# Patient Record
Sex: Male | Born: 1995 | Race: Black or African American | Hispanic: No | Marital: Single | Smoking: Former smoker
Health system: Southern US, Community
[De-identification: ages and names within clinical notes are randomized; demographics above are authoritative.]

---
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
## 2013-01-11 ENCOUNTER — Encounter (HOSPITAL_COMMUNITY): Payer: Self-pay | Admitting: Emergency Medicine

## 2013-01-11 ENCOUNTER — Emergency Department (HOSPITAL_COMMUNITY)
Admission: EM | Admit: 2013-01-11 | Discharge: 2013-01-11 | Disposition: A | Discharge status: Home / Self Care | Payer: Medicaid Other | Attending: Emergency Medicine | Admitting: Emergency Medicine

## 2013-01-11 ENCOUNTER — Emergency Department (HOSPITAL_COMMUNITY): Payer: Medicaid Other

## 2013-01-11 DIAGNOSIS — Y929 Unspecified place or not applicable: Secondary | ICD-10-CM | POA: Insufficient documentation

## 2013-01-11 DIAGNOSIS — S91331A Puncture wound without foreign body, right foot, initial encounter: Secondary | ICD-10-CM

## 2013-01-11 DIAGNOSIS — S91309A Unspecified open wound, unspecified foot, initial encounter: Secondary | ICD-10-CM | POA: Insufficient documentation

## 2013-01-11 DIAGNOSIS — W268XXA Contact with other sharp object(s), not elsewhere classified, initial encounter: Secondary | ICD-10-CM | POA: Insufficient documentation

## 2013-01-11 DIAGNOSIS — Z23 Encounter for immunization: Secondary | ICD-10-CM | POA: Insufficient documentation

## 2013-01-11 DIAGNOSIS — F17200 Nicotine dependence, unspecified, uncomplicated: Secondary | ICD-10-CM | POA: Insufficient documentation

## 2013-01-11 DIAGNOSIS — Y939 Activity, unspecified: Secondary | ICD-10-CM | POA: Insufficient documentation

## 2013-01-11 MED ORDER — AMOXICILLIN-POT CLAVULANATE 875-125 MG PO TABS: 1.0000 | ORAL_TABLET | Freq: Two times a day (BID) | ORAL | Status: DC

## 2013-01-11 MED ORDER — IBUPROFEN 800 MG PO TABS: 800.0000 mg | ORAL_TABLET | Freq: Three times a day (TID) | ORAL | Status: DC

## 2013-01-11 MED ORDER — AMOXICILLIN-POT CLAVULANATE 875-125 MG PO TABS
1.0000 | ORAL_TABLET | Freq: Once | ORAL | Status: AC
  Administered 2013-01-11: 1 via ORAL
  Filled 2013-01-11: qty 1

## 2013-01-11 MED ORDER — TETANUS-DIPHTH-ACELL PERTUSSIS 5-2.5-18.5 LF-MCG/0.5 IM SUSP
0.5000 mL | Freq: Once | INTRAMUSCULAR | Status: AC
  Administered 2013-01-11: 0.5 mL via INTRAMUSCULAR
  Filled 2013-01-11: qty 0.5

## 2013-01-11 NOTE — ED Notes (Signed)
Stepped on a nail to plantar surface of rt foot 2 days ago
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
## 2013-01-13 NOTE — ED Provider Notes (Signed)
CSN: [id number]     Arrival date & time 01/11/13  1111 History   First MD Initiated Contact with Patient 01/11/13 1208     Chief Complaint  Patient presents with  . Foot Pain   (Consider location/radiation/quality/duration/timing/severity/associated sxs/prior Treatment) Patient is a 17 y.o. male presenting with lower extremity pain. The history is provided by the patient.  Foot Pain This is a new problem. Episode onset: 2 days ago. The problem occurs constantly. The problem has been unchanged. Associated symptoms include arthralgias. Pertinent negatives include no chills, fever, headaches, joint swelling, nausea, neck pain, numbness, rash, vomiting or weakness. The symptoms are aggravated by walking and standing. He has tried nothing for the symptoms. The treatment provided no relief.   patient reports pain to plantar surface of the right foot after stepping on a nail 2 days ago.  Also c/o redness to the area and states he does not remember his last tetanus injection.  He denies fever, chills, swelling or red streaks from the wound History reviewed. No pertinent past medical history. History reviewed. No pertinent past surgical history. History reviewed. No pertinent family history. History  Substance Use Topics  . Smoking status: Current Every Day Smoker  . Smokeless tobacco: Not on file  . Alcohol Use: Yes    Review of Systems  Constitutional: Negative for fever and chills.  Gastrointestinal: Negative for nausea and vomiting.  Genitourinary: Negative for dysuria and difficulty urinating.  Musculoskeletal: Positive for arthralgias. Negative for joint swelling and neck pain.       Puncture wound to right foot  Skin: Negative for color change, rash and wound.  Neurological: Negative for weakness, numbness and headaches.  All other systems reviewed and are negative.    Allergies  Review of patient's allergies indicates no known allergies.  Home Medications   Current Outpatient  Rx  Name  Route  Sig  Dispense  Refill  . amoxicillin-clavulanate (AUGMENTIN) 875-125 MG per tablet   Oral   Take 1 tablet by mouth 2 (two) times daily. For 10 days   20 tablet   0   . ibuprofen (ADVIL,MOTRIN) 800 MG tablet   Oral   Take 1 tablet (800 mg total) by mouth 3 (three) times daily.   21 tablet   0    BP 129/70  Pulse 85  Temp(Src) 97.5 F (36.4 C) (Oral)  Ht 6' (1.829 m)  Wt 230 lb (104.327 kg)  BMI 31.19 kg/m2  SpO2 100% Physical Exam  Nursing note and vitals reviewed. Constitutional: He is oriented to person, place, and time. He appears well-developed and well-nourished. No distress.  HENT:  Head: Normocephalic and atraumatic.  Cardiovascular: Normal rate, regular rhythm, normal heart sounds and intact distal pulses.   Pulmonary/Chest: Effort normal and breath sounds normal. No respiratory distress.  Musculoskeletal: He exhibits tenderness. He exhibits no edema.  Pin point wound to plantar surface of right mid foot. No lymphangitis  ROM is preserved.  DP pulse is brisk,distal sensation intact.  No erythema, abrasion, bruising or bony deformity.  No proximal tenderness.  Neurological: He is alert and oriented to person, place, and time. He exhibits normal muscle tone. Coordination normal.  Skin: Skin is warm and dry. No rash noted. No erythema.    ED Course  Procedures (including critical care time) Labs Review Labs Reviewed - No data to display  Imaging Review Dg Foot Complete Right  01/11/2013   CLINICAL DATA:  Right foot pain, recent penetrating injury  EXAM: RIGHT FOOT  COMPLETE - 3+ VIEW  COMPARISON:  None.  FINDINGS: No acute fracture or dislocation is noted. No gross soft tissue abnormality is seen. No radiopaque foreign body is noted.  IMPRESSION: No acute abnormality seen.   Electronically Signed   By: Alcide Clever M.D.   On: 01/11/2013 12:34    EKG Interpretation   None       MDM   1. Puncture wound of foot, right, initial encounter     Puncture wound to right foot.  No concerning sx's for cellulitis at this time, NV intact.  Will update Td and rx augmentin.  Pt agrees to return here if any worsening sx's.  Ambulates w/o difficulty    Felicita Nuncio L. Natori Gudino, PA-C 01/13/13 1326

## 2013-01-14 NOTE — ED Provider Notes (Signed)
Medical screening examination/treatment/procedure(s) were performed by non-physician practitioner and as supervising physician I was immediately available for consultation/collaboration.  EKG Interpretation   None         Kateland Leisinger, MD 01/14/13 0942 

## 2013-02-25 ENCOUNTER — Ambulatory Visit: Payer: Self-pay | Admitting: Nurse Practitioner

## 2013-03-14 DIAGNOSIS — Z0289 Encounter for other administrative examinations: Secondary | ICD-10-CM

## 2013-12-26 ENCOUNTER — Ambulatory Visit (INDEPENDENT_AMBULATORY_CARE_PROVIDER_SITE_OTHER): Payer: Medicaid Other | Admitting: Family Medicine

## 2013-12-26 ENCOUNTER — Ambulatory Visit (HOSPITAL_COMMUNITY)
Admission: RE | Admit: 2013-12-26 | Discharge: 2013-12-26 | Disposition: A | Discharge status: Home / Self Care | Payer: Medicaid Other | Source: Ambulatory Visit | Attending: Family Medicine | Admitting: Family Medicine

## 2013-12-26 ENCOUNTER — Encounter: Payer: Self-pay | Admitting: Family Medicine

## 2013-12-26 VITALS — BP 118/76 | Ht 74.0 in | Wt 264.0 lb

## 2013-12-26 DIAGNOSIS — M25511 Pain in right shoulder: Secondary | ICD-10-CM

## 2013-12-26 MED ORDER — NAPROXEN 500 MG PO TABS: 500.0000 mg | ORAL_TABLET | Freq: Two times a day (BID) | ORAL | Status: DC | PRN
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
## 2013-12-27 ENCOUNTER — Ambulatory Visit (HOSPITAL_COMMUNITY)
Admission: RE | Admit: 2013-12-27 | Discharge: 2013-12-27 | Disposition: A | Discharge status: Home / Self Care | Payer: Medicaid Other | Source: Ambulatory Visit | Attending: Family Medicine | Admitting: Family Medicine

## 2013-12-27 DIAGNOSIS — M25511 Pain in right shoulder: Secondary | ICD-10-CM | POA: Diagnosis not present

## 2013-12-30 NOTE — Progress Notes (Signed)
Patient notified and verbalized understanding. 

## 2014-01-12 ENCOUNTER — Encounter: Payer: Self-pay | Admitting: Orthopedic Surgery

## 2014-01-12 ENCOUNTER — Ambulatory Visit (INDEPENDENT_AMBULATORY_CARE_PROVIDER_SITE_OTHER): Payer: Medicaid Other | Admitting: Orthopedic Surgery

## 2014-01-12 VITALS — BP 103/67 | Ht 74.0 in | Wt 264.0 lb

## 2014-01-12 DIAGNOSIS — S43004A Unspecified dislocation of right shoulder joint, initial encounter: Secondary | ICD-10-CM

## 2014-01-12 NOTE — Progress Notes (Signed)
Patient ID: Tanner Rhodes, male   DOB: [birth year], 18 y.o.   MRN: [id number] Subjective:     Tanner Rhodes is a 18 y.o. male who injured his shoulder playing football about 2 years ago. Since that time he has a painful clicking sensation over the right shoulder which is reproducible by manipulating the shoulder. He has significant pain over his before meals joint and pain with forward elevation with radiates into his neck and is associated with some numbness in his right upper extremity. The pain is become constant and is severe enough to be 8 out of 10. He had a negative x-ray. His pain is worse with throwing, lifting. He says that he feels a grinding sensation in the shoulder as well. Previous treatment none  Outside reports reviewed: office notes.  The following portions of the patient's history were reviewed and updated as appropriate: allergies, current medications, past family history, past medical history, past social history, past surgical history and problem list.   the patient reports his review of systems as vision problems numbness tingling weakness joint pain muscle weakness stiff joints and back pain  BP 103/67 mmHg  Ht 6\' 2"  (1.88 m)  Wt 264 lb (119.75 kg)  BMI 33.88 kg/m2  Objective:    General:  alert, cooperative, appears stated age, mildly obese and normal hygiene normal grooming, mood and affect normal. He is oriented 3.  Gait:  Normal.    Right Shoulder Bruising:   absent  Crepitus:  AC joint, glenohumeral joint, scapulo-thoracic  Joint Tenderness:  AC joint, posterior acromial, trapezius muscle  Effusion:   none present           Active ROM:   he has painful for elevation between the ranges of motion of 100 and 150. He has normal external rotation normal abduction at 90 with external rotation normal.  Neer:   positive  Hawkins  positive  Stability:   apprehension sign anterior  Apprehension Sign:   neg/pos positive/positive  Atrophy:   none noted   Strength:  biceps 5/5, triceps 5/5, abduction 5/5, adduction 5/5, external rotation 5/5 with shoulder at side, flexion 5/5, and extension 5/5  Left shoulder Normal range of motion, stability and strength. No tenderness. SKIN normal both shoulders CV normal radial pulses bilaterally LYMPH negative palpable lymph nodes bilaterally axilla and supraclavicular SENSATION normal palpable soft touch proprioceptive sensation in each upper extremity DTR 2+ at the elbow and forearm bilaterally equal COORDINATION normal in both upper extremities with fine motor tasks  CSPINE EVAL: no tenderness or loss of motion Imaging X-Ray: the x-ray taken at the hospital does not show any abnormality and is has been independently reviewed  Assessment:    right shoulder abnormality I cannot tell if he has dislocation of the glenohumeral joint or an old before meals joint injury     Plan:  Recommend MRI of the right shoulder to evaluate the glenohumeral ligaments and the before meals ligaments  Recommend Naprosyn 500 mg twice a day #60

## 2014-01-12 NOTE — Patient Instructions (Signed)
MRI and [facility] follow up.

## 2014-01-25 ENCOUNTER — Telehealth: Payer: Self-pay | Admitting: Orthopedic Surgery

## 2014-01-25 ENCOUNTER — Telehealth: Payer: Self-pay | Admitting: *Deleted

## 2014-01-25 NOTE — Telephone Encounter (Addendum)
Regarding MRI ordered for right shoulder,[id number] -- received denial, 01/24/14, per [employer], insurer Medicaid's third party contact (Ph [phone]).  I called patient to notify; left message at home# [phone] to return call.  Patient will also receive a letter from insurer with this information and with detailed information as to options. * *Patient returned call same day, 01/24/14, at approximately 4:05p.m; voiced understanding of above information.

## 2014-01-25 NOTE — Telephone Encounter (Signed)
NOTE  SENT TO PATIENTS PCP ADVISING OF DR HARRISON'S RECOMMENDATION FOR NEUROLOGY REFERRAL FOR [facility] ADVISED PATIENTS INSURANCE REQUIRES REFERRAL FROM PCP OUR OFFICE NOTE WAS ATTACHED

## 2014-01-29 ENCOUNTER — Other Ambulatory Visit: Payer: Self-pay | Admitting: Family Medicine

## 2014-01-29 DIAGNOSIS — R202 Paresthesia of skin: Principal | ICD-10-CM

## 2014-01-29 DIAGNOSIS — R200 Anesthesia of skin: Secondary | ICD-10-CM

## 2014-01-29 NOTE — Progress Notes (Signed)
The patient having some tingling in the right upper extremity. Dr. [facility] would like nerve conduction study via neurology. Consult was put in.

## 2014-01-31 ENCOUNTER — Encounter: Payer: Self-pay | Admitting: Family Medicine

## 2014-11-28 ENCOUNTER — Encounter: Payer: Self-pay | Admitting: Family Medicine

## 2014-11-28 ENCOUNTER — Ambulatory Visit (INDEPENDENT_AMBULATORY_CARE_PROVIDER_SITE_OTHER): Payer: Medicaid Other | Admitting: Family Medicine

## 2014-11-28 VITALS — BP 124/82 | Temp 97.9°F | Ht 74.0 in | Wt 276.2 lb

## 2014-11-28 DIAGNOSIS — M2440 Recurrent dislocation, unspecified joint: Secondary | ICD-10-CM | POA: Diagnosis not present

## 2014-11-28 DIAGNOSIS — M7662 Achilles tendinitis, left leg: Secondary | ICD-10-CM

## 2014-11-28 DIAGNOSIS — M7581 Other shoulder lesions, right shoulder: Secondary | ICD-10-CM

## 2014-11-28 DIAGNOSIS — M25511 Pain in right shoulder: Secondary | ICD-10-CM | POA: Diagnosis not present

## 2014-11-28 MED ORDER — DICLOFENAC SODIUM 75 MG PO TBEC: 75.0000 mg | DELAYED_RELEASE_TABLET | Freq: Two times a day (BID) | ORAL | Status: DC

## 2014-11-28 NOTE — Progress Notes (Signed)
   Subjective:    Patient ID: Tanner Rhodes, male    DOB: 1996-03-01, 19 y.o.   MRN: [id number]  Foot Injury  The incident occurred more than 1 week ago. The incident occurred at home. There was no injury mechanism. The pain is present in the left heel. The quality of the pain is described as aching. The pain is moderate. The pain has been intermittent since onset. He reports no foreign bodies present. The symptoms are aggravated by weight bearing. He has tried NSAIDs for the symptoms. The treatment provided no relief.  Shoulder Pain  The pain is present in the right shoulder. This is a chronic problem. The current episode started more than 1 year ago. The problem occurs intermittently. The problem has been unchanged. The quality of the pain is described as aching. The pain is moderate. The symptoms are aggravated by activity. He has tried NSAIDS for the symptoms. The treatment provided no relief.   He states he did see orthopedics at MRI scheduled and he believes that he missed the appointment was canceled he has not been able to go back to get it rescheduled. He has seen orthopedics he has tried anti-inflammatory is try to exercise without success    Review of Systems He relates shoulder pain discomfort hurts with movement related to intermittently dislocates patient also relates intermittent left heel pain and discomfort when he walks    Objective:   Physical Exam On physical exam neck no tenderness no masses. She does have some moderate trapezius tenderness. He has limited range of motion of the right shoulder. Positive apprehension test. Also patient has difficult time putting on behind the right back region and has limited strength in raising the arm upward. Significant pain and tenderness in the joint line. Patient is also able to intermittently dislocate his shoulder partial. Lungs are clear hearts regular left foot patient has significant acute tendinitis but no sign of any type of  fracture no sign of plantar fasciitis       Assessment & Plan:  Right shoulder pain chronic with intermittent dislocation patient with probable rotator cuff tear versus tendinitis has Arty tried conservative measures including a few months of anti-inflammatory gentle range of motion exercises without success. I recommend MRI. May well need referral back to Dr. Romeo Apple.  Left heel East a anti-inflammatory cold compresses if ongoing trouble may need injections

## 2014-11-30 ENCOUNTER — Other Ambulatory Visit: Payer: Self-pay | Admitting: *Deleted

## 2014-11-30 MED ORDER — MELOXICAM 15 MG PO TABS: 15.0000 mg | ORAL_TABLET | Freq: Every day | ORAL | Status: DC

## 2014-12-12 ENCOUNTER — Ambulatory Visit (HOSPITAL_COMMUNITY)
Admission: RE | Admit: 2014-12-12 | Discharge: 2014-12-12 | Disposition: A | Discharge status: Home / Self Care | Payer: Medicaid Other | Source: Ambulatory Visit | Attending: Family Medicine | Admitting: Family Medicine

## 2014-12-12 DIAGNOSIS — M25411 Effusion, right shoulder: Secondary | ICD-10-CM | POA: Insufficient documentation

## 2014-12-12 DIAGNOSIS — M25511 Pain in right shoulder: Secondary | ICD-10-CM | POA: Diagnosis present

## 2014-12-12 DIAGNOSIS — R937 Abnormal findings on diagnostic imaging of other parts of musculoskeletal system: Secondary | ICD-10-CM | POA: Diagnosis not present

## 2014-12-18 ENCOUNTER — Other Ambulatory Visit: Payer: Self-pay

## 2014-12-18 DIAGNOSIS — M778 Other enthesopathies, not elsewhere classified: Secondary | ICD-10-CM

## 2014-12-18 DIAGNOSIS — M7581 Other shoulder lesions, right shoulder: Principal | ICD-10-CM

## 2015-01-01 ENCOUNTER — Ambulatory Visit: Payer: Medicaid Other | Admitting: Orthopedic Surgery

## 2015-01-01 ENCOUNTER — Encounter: Payer: Self-pay | Admitting: Orthopedic Surgery

## 2015-01-11 ENCOUNTER — Encounter: Payer: Self-pay | Admitting: Orthopedic Surgery

## 2015-01-11 ENCOUNTER — Ambulatory Visit (INDEPENDENT_AMBULATORY_CARE_PROVIDER_SITE_OTHER): Payer: Medicaid Other | Admitting: Orthopedic Surgery

## 2015-01-11 VITALS — BP 122/74 | Ht 74.0 in | Wt 276.0 lb

## 2015-01-11 DIAGNOSIS — S43004S Unspecified dislocation of right shoulder joint, sequela: Secondary | ICD-10-CM | POA: Diagnosis not present

## 2015-01-11 MED ORDER — NAPROXEN 500 MG PO TABS: 500.0000 mg | ORAL_TABLET | Freq: Two times a day (BID) | ORAL | Status: DC

## 2015-01-11 NOTE — Patient Instructions (Addendum)
We will let your primary care doctor of recommendation to refer to shoulder specialist Dr Dion SaucierLandau  Medicine sent to your pharmacy

## 2015-01-11 NOTE — Progress Notes (Signed)
Chief complaint persistent clicking popping right shoulder  19 year old male injured his shoulder playing football 2 and 3:30 years ago. Since that time he has had a painful clicking sensation of the right shoulder which is reproducible with manipulation of the shoulder. He has significant pain over the acromial clavicular joint and pain with abduction external rotation and elevation of the shoulder. He was treated with ibuprofen and did not improve he persists of complaints of a grinding sensation and catching sensation in the shoulder.  Review of systems various vision disturbance, mild numbness and tingling in the right arm on occasion weakness of the shoulder joint and stiffness. Also complains of back pain.  BP 122/74 mmHg  Ht 6\' 2"  (1.88 m)  Wt 276 lb (125.193 kg)  BMI 35.42 kg/m2 Again the shoulder clicked and popped had a painful arc of motion between 100 and 150 his external rotation was normal in abduction at 90 with external rotation normal. Neer sign positive Hawkins sign positive apprehension with abduction external rotation no atrophy scapular motion appeared to be relatively cohesive with the shoulder joint.  No neurovascular deficits detected  Axillary lymph nodes normal  I suspect he has micro-instability because he doesn't have rotator cuff tear MRI prove that  Recommend second opinion with Dr. Dion SaucierLandau for micro-instability. Wonder if he may need exam under anesthesia and then arthroscopic capsulorrhaphy.

## 2015-07-28 ENCOUNTER — Emergency Department (HOSPITAL_COMMUNITY): Payer: Medicaid Other

## 2015-07-28 ENCOUNTER — Emergency Department (HOSPITAL_COMMUNITY)
Admission: EM | Admit: 2015-07-28 | Discharge: 2015-07-28 | Disposition: A | Discharge status: Home / Self Care | Payer: Medicaid Other | Attending: Emergency Medicine | Admitting: Emergency Medicine

## 2015-07-28 ENCOUNTER — Encounter (HOSPITAL_COMMUNITY): Payer: Self-pay

## 2015-07-28 DIAGNOSIS — S61309A Unspecified open wound of unspecified finger with damage to nail, initial encounter: Secondary | ICD-10-CM

## 2015-07-28 DIAGNOSIS — Y929 Unspecified place or not applicable: Secondary | ICD-10-CM | POA: Insufficient documentation

## 2015-07-28 DIAGNOSIS — Y939 Activity, unspecified: Secondary | ICD-10-CM | POA: Diagnosis not present

## 2015-07-28 DIAGNOSIS — Y999 Unspecified external cause status: Secondary | ICD-10-CM | POA: Insufficient documentation

## 2015-07-28 DIAGNOSIS — S61306A Unspecified open wound of right little finger with damage to nail, initial encounter: Secondary | ICD-10-CM | POA: Diagnosis not present

## 2015-07-28 DIAGNOSIS — S6721XA Crushing injury of right hand, initial encounter: Secondary | ICD-10-CM

## 2015-07-28 DIAGNOSIS — W230XXA Caught, crushed, jammed, or pinched between moving objects, initial encounter: Secondary | ICD-10-CM | POA: Insufficient documentation

## 2015-07-28 DIAGNOSIS — S67196A Crushing injury of right little finger, initial encounter: Secondary | ICD-10-CM | POA: Diagnosis present

## 2015-07-28 DIAGNOSIS — Z87891 Personal history of nicotine dependence: Secondary | ICD-10-CM | POA: Diagnosis not present

## 2015-07-28 MED ORDER — BUPIVACAINE HCL (PF) 0.5 % IJ SOLN
10.0000 mL | Freq: Once | INTRAMUSCULAR | Status: AC
  Administered 2015-07-28: 10 mL
  Filled 2015-07-28: qty 30

## 2015-07-28 MED ORDER — TRAMADOL HCL 50 MG PO TABS: 50.0000 mg | ORAL_TABLET | Freq: Four times a day (QID) | ORAL | Status: DC | PRN

## 2015-07-28 MED ORDER — NAPROXEN 500 MG PO TABS: 500.0000 mg | ORAL_TABLET | Freq: Two times a day (BID) | ORAL | Status: DC

## 2015-07-28 NOTE — ED Notes (Signed)
Was closing the trunk and got my little finger cut per pt.

## 2015-07-28 NOTE — Discharge Instructions (Signed)
Fingernail or Toenail Removal, Care After Refer to this sheet in the next few weeks. These instructions provide you with information about caring for yourself after your procedure. Your health care provider may also give you more specific instructions. Your treatment has been planned according to current medical practices, but problems sometimes occur. Call your health care provider if you have any problems or questions after your procedure. WHAT TO EXPECT AFTER THE PROCEDURE After your procedure, it is common to have:  Redness.  Swelling. HOME CARE INSTRUCTIONS  If you have a splint on your finger:  Wear it as directed by your health care provider. Remove it only as directed by your health care provider.  Loosen the splint if your fingers become numb and tingle, or if they turn cold and blue.  If you were given a surgical shoe, wear it as directed by your health care provider.  Take medicines only as directed by your health care provider.  Elevate your hand or foot as much of the time as possible. This helps with pain and swelling.  If you are recovering from fingernail removal, keep your hand raised above the level of your heart.  If you are recovering from toenail removal, lie on a bed or a couch with your leg propped up on pillows, or sit in a reclining chair with the footrest up.  Follow instructions from your health care provider about bandage (dressing) changes and removal:  Change your dressing 24 hours after your procedure or as directed by your health care provider.  Soak your hand or foot in warm, soapy water for 10 minutes 2 times per day. This reduces pain and swelling.  After you soak your hand or foot, apply a clean, dry dressing.  Keep your dressing clean and dry. Change your dressing whenever it gets wet or dirty.  Keep all follow-up visits as directed by your health care provider. This is important. SEEK MEDICAL CARE IF:  You have increased redness or pain at  your nail area.  You have increased fluid, blood, or pus coming from your nail area.  There is a bad smell coming from the dressing.  You have a fever.  Your swelling gets worse, or you have swelling that spreads from your finger to your hand or from your toe to your foot.  You have worsening redness that spreads from your finger to your hand or from your toe up to your foot.  Your finger or toe looks blue or black.   This information is not intended to replace advice given to you by your health care provider. Make sure you discuss any questions you have with your health care provider.   Document Released: 03/17/2014 Document Reviewed: 03/17/2014 Elsevier Interactive Patient Education [employer]2016 [employer]

## 2015-07-28 NOTE — ED Notes (Signed)
Patient verbalizes understanding of discharge instructions, prescriptions, home care and follow up care. Patient out of department at this time. 

## 2015-07-28 NOTE — ED Provider Notes (Signed)
CSN: [id number]     Arrival date & time 07/28/15  1932 History   First MD Initiated Contact with Patient 07/28/15 1951     Chief Complaint  Patient presents with  . Finger Injury     (Consider location/radiation/quality/duration/timing/severity/associated sxs/prior Treatment) The history is provided by the patient.   Tanner Rhodes is a 20 y.o. right handed male presenting with injury to the distal 5th finger and nail from crushing it in the trunk of his car just prior to arrival.  He denies any other injury.  He describes constant throbbing pain at the site and has had no medicationsOr treatments prior to arriving here.  His last tetanus was given in 2014.    History reviewed. No pertinent past medical history. History reviewed. No pertinent past surgical history. No family history on file. Social History  Substance Use Topics  . Smoking status: Former Games developer  . Smokeless tobacco: None  . Alcohol Use: Yes    Review of Systems  Constitutional: Negative for fever.  Musculoskeletal: Positive for arthralgias. Negative for myalgias and joint swelling.  Skin: Positive for wound.  Neurological: Negative for weakness and numbness.      Allergies  Shrimp  Home Medications   Prior to Admission medications   Medication Sig Start Date End Date Taking? Authorizing Provider  meloxicam (MOBIC) 15 MG tablet Take 1 tablet (15 mg total) by mouth daily. 11/30/14   Babs Sciara, MD  naproxen (NAPROSYN) 500 MG tablet Take 1 tablet (500 mg total) by mouth 2 (two) times daily with a meal. 01/11/15   Vickki Hearing, MD  naproxen (NAPROSYN) 500 MG tablet Take 1 tablet (500 mg total) by mouth 2 (two) times daily. 07/28/15   Burgess Amor, PA-C  traMADol (ULTRAM) 50 MG tablet Take 1 tablet (50 mg total) by mouth every 6 (six) hours as needed. 07/28/15   Burgess Amor, PA-C   BP 121/79 mmHg  Pulse 79  Temp(Src) 98.4 F (36.9 C) (Temporal)  Resp 18  Ht  (1.88 m)  Wt 123.832 kg  BMI 35.04  kg/m2  SpO2 100% Physical Exam  Constitutional: He appears well-developed and well-nourished.  HENT:  Head: Atraumatic.  Neck: Normal range of motion.  Cardiovascular:  Pulses equal bilaterally  Musculoskeletal: He exhibits tenderness.       Hands: Tender to palpation right distal phalanx with no palpable deformity.  There is a lateral laceration to his mid nail plate with mild bleeding from the site.  Distal sensation is intact with less than 2 second cap refill.  Neurological: He is alert. He has normal strength. He displays normal reflexes. No sensory deficit.  Skin: Skin is warm and dry.  Psychiatric: He has a normal mood and affect.    ED Course  .Nail Removal Date/Time: 07/28/2015 9:08 PM Performed by: Burgess Amor Authorized by: Burgess Amor Risks and benefits: risks, benefits and alternatives were discussed Consent given by: patient Patient identity confirmed: verbally with patient Time out: Immediately prior to procedure a "time out" was called to verify the correct patient, procedure, equipment, support staff and site/side marked as required. Location: right hand Location details: right small finger Anesthesia method: per above note. Preparation: skin prepped with Betadine Amount removed: partial (distal 1/2) Nail bed sutured: no (no laceration to nail bed) Nail matrix removed: none Removed nail replaced and anchored: no Dressing: dressing applied Patient tolerance: Patient tolerated the procedure well with no immediate complications Comments: Finger splint provided   (including critical  care time)  NERVE BLOCK Performed by: Burgess AmorIDOL, Cherry Wittwer Consent: Verbal consent obtained. Required items: required blood products, implants, devices, and special equipment available Time out: Immediately prior to procedure a "time out" was called to verify the correct patient, procedure, equipment, support staff and site/side marked as required.  Indication: pain in finger Nerve block  body site: right 5th finger  Preparation: Patient was prepped and draped in the usual sterile fashion. Needle gauge: 25G Location technique: anatomical landmarks  Local anesthetic: Bupivacaine 0.5%   Anesthetic total: 1 ml  Outcome: pain improved Patient tolerance: Patient tolerated the procedure well with no immediate complications.   Labs Review Labs Reviewed - No data to display  Imaging Review Dg Finger Little Right  07/28/2015  CLINICAL DATA:  Laceration to the distal right little finger after slamming finger in car trunk. Initial encounter. EXAM: RIGHT LITTLE FINGER 2+V COMPARISON:  Right fifth finger radiographs performed 09/08/2006 FINDINGS: There is no evidence of fracture or dislocation. The right fifth finger appears intact. No definite soft tissue abnormalities are characterized on radiograph. Visualized joint spaces are preserved. IMPRESSION: No evidence of fracture or dislocation. Electronically Signed   By: Roanna RaiderJeffery  Chang M.D.   On: 07/28/2015 20:41   I have personally reviewed and evaluated these images and lab results as part of my medical decision-making.   EKG Interpretation None      MDM   Final diagnoses:  Crushing injury of finger of right hand, initial encounter  Traumatic avulsion of nail plate of finger, initial encounter      Radiological studies were viewed, interpreted and considered during the medical decision making and disposition process. I agree with radiologists reading.  Results were also discussed with patient.  Patient was placed in a Telfa dressing followed by a finger splint to protect the distal finger.  He was advised twice a day wash and mild soapy water followed by new dressing.  Advised his nail should grow back without problems over the next 4-6 weeks.  When necessary follow-up anticipated.     Burgess AmorJulie Reedy Biernat, PA-C 07/28/15 2109  Eber HongBrian Miller, MD 07/30/15 [phone]

## 2015-09-24 ENCOUNTER — Ambulatory Visit (INDEPENDENT_AMBULATORY_CARE_PROVIDER_SITE_OTHER): Payer: Medicaid Other | Admitting: Family Medicine

## 2015-09-24 ENCOUNTER — Encounter: Payer: Self-pay | Admitting: Family Medicine

## 2015-09-24 VITALS — BP 118/80 | Ht 74.0 in | Wt 263.2 lb

## 2015-09-24 DIAGNOSIS — F418 Other specified anxiety disorders: Secondary | ICD-10-CM

## 2015-09-24 DIAGNOSIS — F419 Anxiety disorder, unspecified: Principal | ICD-10-CM

## 2015-09-24 DIAGNOSIS — F329 Major depressive disorder, single episode, unspecified: Secondary | ICD-10-CM

## 2015-09-24 MED ORDER — ALPRAZOLAM 0.5 MG PO TABS: ORAL_TABLET | ORAL | Status: DC

## 2015-09-24 NOTE — Progress Notes (Signed)
   Subjective:    Patient ID: Tanner Rhodes, male    DOB: [birth year], 20 y.o.   MRN: [id number]  Depression        This is a new problem.  The current episode started more than 1 month ago.   The onset quality is sudden.  Patient finds himself feeling blue sad feeling down times denies being suicidal. States this been going on for at least a year he states he once to get better for his child he Arty has an one that is on the way. Patient has been a mental health before. Patient states at times he feels sad down blue denies being angry denies being suicidal denies wanting to hurt anybody Patient states no other concerns this visit.  Review of Systems  Psychiatric/Behavioral: Positive for depression.   Patient relates intermittent anxious spells denies hearing voices denies being paranoid    Objective:   Physical Exam  Patient was coherent and stable acting.  Patient voices understanding that depression in young individuals can be challenging in her best handled by counseling as well as seen a mental health specialist he is willing to go    Assessment & Plan:  15 minutes spent with patient discussing his case I believe he has depression with some anxiety Because of his age I believe it is best for him to see a mental health specialist because SSRIs sometimes can cause increased depression and suicidal ideation I believe it is best for her psychiatrist Xanax prescribed for sparing use caution drowsiness Patient denies being homicidal or suicidal currently Xanax as necessary for anxiety not for frequent use

## 2015-09-24 NOTE — Patient Instructions (Signed)
Depression is a complex issue that I would recommend that we have you see a mental health specialist to help you in the best possible way we will help set this up and call you with the appointment

## 2015-10-01 ENCOUNTER — Telehealth: Payer: Self-pay | Admitting: Family Medicine

## 2015-10-01 DIAGNOSIS — F32A Depression, unspecified: Secondary | ICD-10-CM

## 2015-10-01 DIAGNOSIS — F329 Major depressive disorder, single episode, unspecified: Secondary | ICD-10-CM

## 2015-10-01 MED ORDER — ALPRAZOLAM 0.5 MG PO TABS: ORAL_TABLET | ORAL | 0 refills | Status: DC

## 2015-10-01 NOTE — Telephone Encounter (Signed)
Spoke with patient and informed him per Dr.Scott Luking- you are relying on the medication too much. Need to use sparingly. We are sending over a prescription of 15 tablets. This is not a day in and day out use. Further prescription of these type of medicines will be under the direction of mental health. Patient verbalized understanding. Referral in epic

## 2015-10-01 NOTE — Telephone Encounter (Signed)
The patient is relying on the medication too much. He needs to use it sparingly. He may have a refill of 15 tablets. This is not for day in and day out use. Further prescriptions of these type of medicines will be under the direction of mental health. Please put in mental health referral.

## 2015-10-01 NOTE — Telephone Encounter (Signed)
Patient is requesting refill on xanax 0.5 mg .

## 2015-10-08 ENCOUNTER — Encounter: Payer: Self-pay | Admitting: Family Medicine

## 2015-10-29 ENCOUNTER — Other Ambulatory Visit: Payer: Self-pay | Admitting: Family Medicine

## 2015-10-30 ENCOUNTER — Telehealth: Payer: Self-pay | Admitting: Family Medicine

## 2015-10-30 NOTE — Telephone Encounter (Signed)
Nurse's-I believe this patient would benefit from seeing psychiatry. He is a young patient. I will not be prescribing ongoing Xanax for him. A referral was made for psychiatry. I have no idea if patient made appointment or not. I have refused refilling this medication via electronics. Once again he needs to be under the care of psychiatry because of his age. Patient having anxiety and depression. If patient needing help with reinitiating referral please do so

## 2015-10-30 NOTE — Telephone Encounter (Signed)
Left message return call 10/30/15 

## 2015-10-30 NOTE — Telephone Encounter (Signed)
Spoke with patient and informed him per Dr.Scott Luking- believe this patient would benefit from seeing psychiatry. He is a young patient. Dr.Scott  will not be prescribing ongoing Xanax for him. A referral was made for psychiatry. . I have refused refilling this medication via electronics. Once again he needs to be under the care of psychiatry because of his age. Patient having anxiety and depression.Patient verbalized understanding.

## 2015-10-30 NOTE — Telephone Encounter (Signed)
Pt is needing refill on his ALPRAZolam (XANAX) 0.5 MG tablet.   [facility]

## 2016-01-16 ENCOUNTER — Telehealth (HOSPITAL_COMMUNITY): Payer: Self-pay | Admitting: *Deleted

## 2016-01-16 NOTE — Telephone Encounter (Signed)
left voice message regarding an appointment. 

## 2016-03-20 ENCOUNTER — Encounter (HOSPITAL_COMMUNITY): Payer: Self-pay

## 2016-03-20 ENCOUNTER — Ambulatory Visit (HOSPITAL_COMMUNITY): Payer: Self-pay | Admitting: Psychiatry

## 2016-10-31 ENCOUNTER — Telehealth: Payer: Self-pay | Admitting: Orthopedic Surgery

## 2016-10-31 NOTE — Telephone Encounter (Signed)
Patient called today asking for an appointment to see Dr. Romeo Apple.  I looked at his last note dated 01/11/15.  It stated in that note that patient should get with his PCP, Dr. Gerda Diss to get a referral to Dr. Dion Saucier.  I asked Ozan if he had done this.  He said he didn't  Know anything about that.  I told him that I was sure Dr. Romeo Apple discussed this with him on his last visit and that it was also on his patient AVS that was given to him when he left the office.  I told him that he would need to get with his Dr. Fletcher Anon office and see if they can get him a referral to Dr. Dion Saucier.

## 2016-11-18 ENCOUNTER — Encounter: Payer: Self-pay | Admitting: Family Medicine

## 2016-11-20 ENCOUNTER — Encounter: Payer: Self-pay | Admitting: Family Medicine

## 2017-03-12 ENCOUNTER — Ambulatory Visit (INDEPENDENT_AMBULATORY_CARE_PROVIDER_SITE_OTHER): Payer: Medicaid Other | Admitting: Family Medicine

## 2017-03-12 ENCOUNTER — Encounter: Payer: Self-pay | Admitting: Family Medicine

## 2017-03-12 VITALS — BP 122/76 | Ht 74.0 in | Wt 279.6 lb

## 2017-03-12 DIAGNOSIS — M25511 Pain in right shoulder: Secondary | ICD-10-CM

## 2017-03-12 DIAGNOSIS — G4700 Insomnia, unspecified: Secondary | ICD-10-CM | POA: Diagnosis not present

## 2017-03-12 DIAGNOSIS — Z13220 Encounter for screening for lipoid disorders: Secondary | ICD-10-CM

## 2017-03-12 DIAGNOSIS — Z131 Encounter for screening for diabetes mellitus: Secondary | ICD-10-CM | POA: Diagnosis not present

## 2017-03-12 MED ORDER — TRAZODONE HCL 50 MG PO TABS: 25.0000 mg | ORAL_TABLET | Freq: Every evening | ORAL | 3 refills | Status: DC | PRN

## 2017-03-12 MED ORDER — DICLOFENAC SODIUM 75 MG PO TBEC: 75.0000 mg | DELAYED_RELEASE_TABLET | Freq: Two times a day (BID) | ORAL | 0 refills | Status: DC

## 2017-03-12 NOTE — Progress Notes (Signed)
   Subjective:    Patient ID: Tanner Rhodes, male    DOB: 18-Sep-1995, 22 y.o.   MRN: [id number]  HPI Patient arrives with c/o right shoulder pain for 2 years. Patient had an MRI done back in 2016 he never ended up going to see the orthopedist he states he is been dealing with the shoulder pain ever since does not radiate down his arm but does cause significant pain and discomfort he is able to work but it does interfere  He has difficult time sleeping he states he has a hard time falling asleep sometimes staying asleep he was requesting something to help him rest  Patient also states at times his moods are sad and negative but denies being suicidal I offered to set him up with a specialist he will think about it and he will let us know  Review of Systems Please see above    Objective:   Physical Exam Limited right shoulder range of motion but still fairly good no weakness detected Subjective discomfort on the anterior aspect Moderate obesity and gynecomastia noted       Assessment & Plan:  Tanner BowenGynecomastia-it is important for this patient try to do the best he can lose weight this would help this issue  Screening lab work recommended  Mild insomnia try trazodone at nighttime to help with sleep  Mild mood disturbance have offered referral for counseling and psychiatry patient defers currently  Right shoulder pain discomfort anti-inflammatory as prescribed-referral to orthopedic surgery await the results

## 2017-03-12 NOTE — Patient Instructions (Addendum)
Serving Sizes A serving size is a measured amount of food or drink, such as one slice of bread, that has an associated nutrient content. Knowing the serving size of a food or drink can help you determine how much of that food you should consume. What is the size of one serving? The size of one healthy serving depends on the food or drink. To determine a serving size, read the food label. If the food or drink does not have a food label, try to find serving size information online. Or, use the following to estimate the size of one adult serving: Grain 1 slice bread.  bagel.  cup pasta. Vegetable  cup cooked or canned vegetables. 1 cup raw, leafy greens. Fruit  cup canned fruit. 1 medium fruit.  cup dried fruit. Meat and Other Protein Sources 1 oz meat, poultry, or fish.  cup cooked beans. 1 egg.  cup nuts or seeds. 1 Tbsp nut butter.  cup tofu or tempeh. 2 Tbsp hummus. Dairy An individual container of yogurt (6-8 oz). 1 piece of cheese the size of your thumb (1 oz). 1 cup (8 oz) milk or milk alternative. Fat A piece the size of one dice. 1 tsp soft margarine. 1 Tbsp mayonnaise. 1 tsp vegetable oil. 1 Tbsp regular salad dressing. 2 Tbsp low-fat salad dressing. How many servings should I eat from each food group each day? The following are the suggested number of servings to try and have every day from each food group. You can also look at your eating throughout the week and aim for meeting these requirements on most days for overall healthy eating. Grain 6-8 servings. Try to have half of your grains from whole grains, such as whole wheat bread, corn tortillas, oatmeal, brown rice, whole wheat pasta, and bulgur. Vegetable At least 2-3 servings. Fruit 2 servings. Meat and Other Protein Foods 5-6 servings. Aim to have lean proteins, such as chicken, [place], fish, beans, or tofu. Dairy 3 servings. Choose low-fat or nonfat if you are trying to control your weight. Fat 2-3  servings. Is a serving the same thing as a portion? No. A portion is the actual amount you eat, which may be more than one serving. Knowing the specific serving size of a food and the nutritional information that goes with it can help you make a healthy decision on what size portion to eat. What are some tips to help me learn healthy serving sizes?  Check food labels for serving sizes. Many foods that come as a single portion actually contain multiple servings.  Determine the serving size of foods you commonly eat and figure out how large a portion you usually eat.  Measure the number of servings that can be held by the bowls, glasses, cups, and plates you typically use. For example, pour your breakfast cereal into your regular bowl and then pour it into a measuring cup.  For 1-2 days, measure the serving sizes of all the foods you eat.  Practice estimating serving sizes and determining how big your portions should be. This information is not intended to replace advice given to you by your health care provider. Make sure you discuss any questions you have with your health care provider. Document Released: 11/23/2002 Document Revised: 10/20/2015 Document Reviewed: 05/24/2013 Elsevier Interactive Patient Education  2018 [employer] Exercising to [employer] MinorLose Weight Exercising can help you to lose weight. In order to lose weight through exercise, you need to do vigorous-intensity exercise. You can tell that you  are exercising with vigorous intensity if you are breathing very hard and fast and cannot hold a conversation while exercising. Moderate-intensity exercise helps to maintain your current weight. You can tell that you are exercising at a moderate level if you have a higher heart rate and faster breathing, but you are still able to hold a conversation. How often should I exercise? Choose an activity that you enjoy and set realistic goals. Your health care provider can help you to make an activity  plan that works for you. Exercise regularly as directed by your health care provider. This may include:  Doing resistance training twice each week, such as: ? Push-ups. ? Sit-ups. ? Lifting weights. ? Using resistance bands.  Doing a given intensity of exercise for a given amount of time. Choose from these options: ? 150 minutes of moderate-intensity exercise every week. ? 75 minutes of vigorous-intensity exercise every week. ? A mix of moderate-intensity and vigorous-intensity exercise every week.  Children, pregnant women, people who are out of shape, people who are overweight, and older adults may need to consult a health care provider for individual recommendations. If you have any sort of medical condition, be sure to consult your health care provider before starting a new exercise program. What are some activities that can help me to lose weight?  Walking at a rate of at least 4.5 miles an hour.  Jogging or running at a rate of 5 miles per hour.  Biking at a rate of at least 10 miles per hour.  Lap swimming.  Roller-skating or in-line skating.  Cross-country skiing.  Vigorous competitive sports, such as football, basketball, and soccer.  Jumping rope.  Aerobic dancing. How can I be more active in my day-to-day activities?  Use the stairs instead of the elevator.  Take a walk during your lunch break.  If you drive, park your car farther away from work or school.  If you take public transportation, get off one stop early and walk the rest of the way.  Make all of your phone calls while standing up and walking around.  Get up, stretch, and walk around every 30 minutes throughout the day. What guidelines should I follow while exercising?  Do not exercise so much that you hurt yourself, feel dizzy, or get very short of breath.  Consult your health care provider prior to starting a new exercise program.  Wear comfortable clothes and shoes with good  support.  Drink plenty of water while you exercise to prevent dehydration or heat stroke. Body water is lost during exercise and must be replaced.  Work out until you breathe faster and your heart beats faster. This information is not intended to replace advice given to you by your health care provider. Make sure you discuss any questions you have with your health care provider. Document Released: 03/29/2010 Document Revised: 08/02/2015 Document Reviewed: 07/28/2013 Elsevier Interactive Patient Education  2018 [employer]. DASH Eating Plan DASH stands for "Dietary Approaches to Stop Hypertension." The DASH eating plan is a healthy eating plan that has been shown to reduce high blood pressure (hypertension). It may also reduce your risk for type 2 diabetes, heart disease, and stroke. The DASH eating plan may also help with weight loss. What are tips for following this plan? General guidelines  Avoid eating more than 2,300 mg (milligrams) of salt (sodium) a day. If you have hypertension, you may need to reduce your sodium intake to 1,500 mg a day.  Limit alcohol intake to  no more than 1 drink a day for nonpregnant women and 2 drinks a day for men. One drink equals 12 oz of beer, 5 oz of wine, or 1 oz of hard liquor.  Work with your health care provider to maintain a healthy body weight or to lose weight. Ask what an ideal weight is for you.  Get at least 30 minutes of exercise that causes your heart to beat faster (aerobic exercise) most days of the week. Activities may include walking, swimming, or biking.  Work with your health care provider or diet and nutrition specialist (dietitian) to adjust your eating plan to your individual calorie needs. Reading food labels  Check food labels for the amount of sodium per serving. Choose foods with less than 5 percent of the Daily Value of sodium. Generally, foods with less than 300 mg of sodium per serving fit into this eating plan.  To find  whole grains, look for the word "whole" as the first word in the ingredient list. Shopping  Buy products labeled as "low-sodium" or "no salt added."  Buy fresh foods. Avoid canned foods and premade or frozen meals. Cooking  Avoid adding salt when cooking. Use salt-free seasonings or herbs instead of table salt or sea salt. Check with your health care provider or pharmacist before using salt substitutes.  Do not fry foods. Cook foods using healthy methods such as baking, boiling, grilling, and broiling instead.  Cook with heart-healthy oils, such as olive, canola, soybean, or sunflower oil. Meal planning   Eat a balanced diet that includes: ? 5 or more servings of fruits and vegetables each day. At each meal, try to fill half of your plate with fruits and vegetables. ? Up to 6-8 servings of whole grains each day. ? Less than 6 oz of lean meat, poultry, or fish each day. A 3-oz serving of meat is about the same size as a deck of cards. One egg equals 1 oz. ? 2 servings of low-fat dairy each day. ? A serving of nuts, seeds, or beans 5 times each week. ? Heart-healthy fats. Healthy fats called Omega-3 fatty acids are found in foods such as flaxseeds and coldwater fish, like sardines, salmon, and mackerel.  Limit how much you eat of the following: ? Canned or prepackaged foods. ? Food that is high in trans fat, such as fried foods. ? Food that is high in saturated fat, such as fatty meat. ? Sweets, desserts, sugary drinks, and other foods with added sugar. ? Full-fat dairy products.  Do not salt foods before eating.  Try to eat at least 2 vegetarian meals each week.  Eat more home-cooked food and less restaurant, buffet, and fast food.  When eating at a restaurant, ask that your food be prepared with less salt or no salt, if possible. What foods are recommended? The items listed may not be a complete list. Talk with your dietitian about what dietary choices are best for  you. Grains Whole-grain or whole-wheat bread. Whole-grain or whole-wheat pasta. Brown rice. Orpah Cobb. Bulgur. Whole-grain and low-sodium cereals. Pita bread. Low-fat, low-sodium crackers. Whole-wheat flour tortillas. Vegetables Fresh or frozen vegetables (raw, steamed, roasted, or grilled). Low-sodium or reduced-sodium tomato and vegetable juice. Low-sodium or reduced-sodium tomato sauce and tomato paste. Low-sodium or reduced-sodium canned vegetables. Fruits All fresh, dried, or frozen fruit. Canned fruit in natural juice (without added sugar). Meat and other protein foods Skinless chicken or [place]. Ground chicken or [place]. Pork with fat trimmed off. Fish and  seafood. Egg whites. Dried beans, peas, or lentils. Unsalted nuts, nut butters, and seeds. Unsalted canned beans. Lean cuts of beef with fat trimmed off. Low-sodium, lean deli meat. Dairy Low-fat (1%) or fat-free (skim) milk. Fat-free, low-fat, or reduced-fat cheeses. Nonfat, low-sodium ricotta or cottage cheese. Low-fat or nonfat yogurt. Low-fat, low-sodium cheese. Fats and oils Soft margarine without trans fats. Vegetable oil. Low-fat, reduced-fat, or light mayonnaise and salad dressings (reduced-sodium). Canola, safflower, olive, soybean, and sunflower oils. Avocado. Seasoning and other foods Herbs. Spices. Seasoning mixes without salt. Unsalted popcorn and pretzels. Fat-free sweets. What foods are not recommended? The items listed may not be a complete list. Talk with your dietitian about what dietary choices are best for you. Grains Baked goods made with fat, such as croissants, muffins, or some breads. Dry pasta or rice meal packs. Vegetables Creamed or fried vegetables. Vegetables in a cheese sauce. Regular canned vegetables (not low-sodium or reduced-sodium). Regular canned tomato sauce and paste (not low-sodium or reduced-sodium). Regular tomato and vegetable juice (not low-sodium or reduced-sodium). Rosita Fire.  Olives. Fruits Canned fruit in a light or heavy syrup. Fried fruit. Fruit in cream or butter sauce. Meat and other protein foods Fatty cuts of meat. Ribs. Fried meat. Tomasa Blase. Sausage. Bologna and other processed lunch meats. Salami. Fatback. Hotdogs. Bratwurst. Salted nuts and seeds. Canned beans with added salt. Canned or smoked fish. Whole eggs or egg yolks. Chicken or [place] with skin. Dairy Whole or 2% milk, cream, and half-and-half. Whole or full-fat cream cheese. Whole-fat or sweetened yogurt. Full-fat cheese. Nondairy creamers. Whipped toppings. Processed cheese and cheese spreads. Fats and oils Butter. Stick margarine. Lard. Shortening. Ghee. Bacon fat. Tropical oils, such as coconut, palm kernel, or palm oil. Seasoning and other foods Salted popcorn and pretzels. Onion salt, garlic salt, seasoned salt, table salt, and sea salt. Worcestershire sauce. Tartar sauce. Barbecue sauce. Teriyaki sauce. Soy sauce, including reduced-sodium. Steak sauce. Canned and packaged gravies. Fish sauce. Oyster sauce. Cocktail sauce. Horseradish that you find on the shelf. Ketchup. Mustard. Meat flavorings and tenderizers. Bouillon cubes. Hot sauce and Tabasco sauce. Premade or packaged marinades. Premade or packaged taco seasonings. Relishes. Regular salad dressings. Where to find more information:  National Heart, Lung, and Blood Institute: PopSteam.is  American Heart Association: www.heart.org Summary  The DASH eating plan is a healthy eating plan that has been shown to reduce high blood pressure (hypertension). It may also reduce your risk for type 2 diabetes, heart disease, and stroke.  With the DASH eating plan, you should limit salt (sodium) intake to 2,300 mg a day. If you have hypertension, you may need to reduce your sodium intake to 1,500 mg a day.  When on the DASH eating plan, aim to eat more fresh fruits and vegetables, whole grains, lean proteins, low-fat dairy, and heart-healthy  fats.  Work with your health care provider or diet and nutrition specialist (dietitian) to adjust your eating plan to your individual calorie needs. This information is not intended to replace advice given to you by your health care provider. Make sure you discuss any questions you have with your health care provider. Document Released: 02/13/2011 Document Revised: 02/18/2016 Document Reviewed: 02/18/2016 Elsevier Interactive Patient Education  [employer].

## 2017-03-19 ENCOUNTER — Encounter: Payer: Self-pay | Admitting: Family Medicine

## 2017-06-01 IMAGING — MR MR SHOULDER*R* W/O CM
4 of 5 series · 27 of 40 positions shown · non-contrast
Comparison: Radiographs 12/27/2013

CLINICAL DATA: Right shoulder pain for 2 years. History of multiple
shoulder dislocations.

EXAM:
MRI OF THE RIGHT SHOULDER WITHOUT CONTRAST
TECHNIQUE: Multiplanar, multisequence MR imaging of the shoulder was performed.
No intravenous contrast was administered.

[Series 3: t2fs axial blade · axial · 3.0mm · 0.62mm/px · z∈[-14,+68]mm · 8 of 26 slices shown]
[im 1/26]
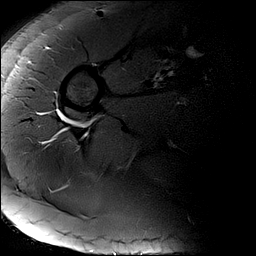
[im 4/26]
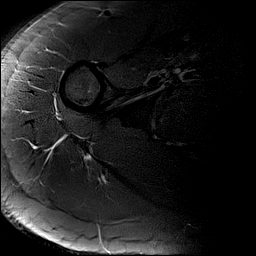
[im 8/26]
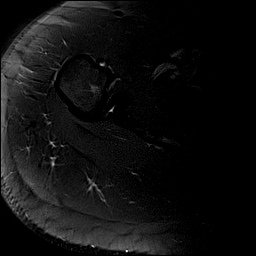
[im 11/26]
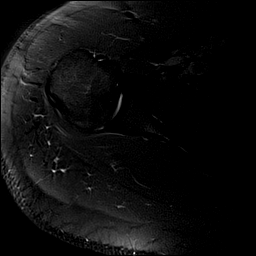
[im 15/26]
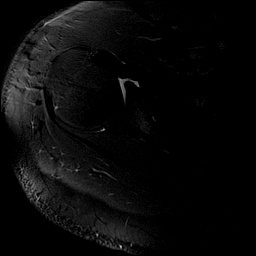
[im 18/26]
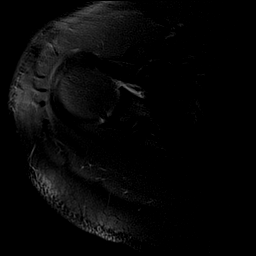
[im 22/26]
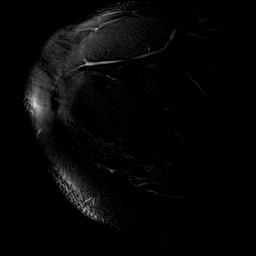
[im 26/26]
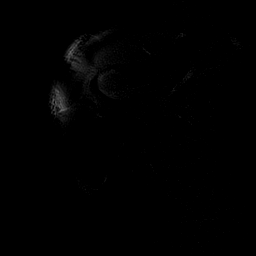

[Series 4: t2fs_blade_cor · oblique · 3.0mm · 0.62mm/px · 3 of 22 slices shown]
[im 4/22]
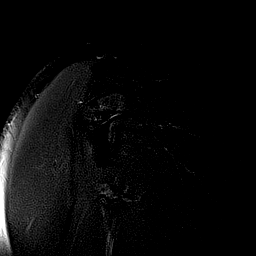
[im 11/22]
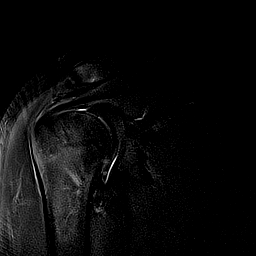
[im 18/22]
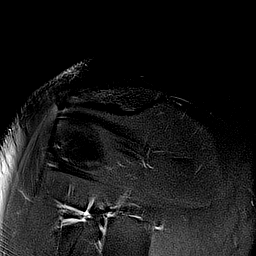

[Series 5: PD · oblique · 3.0mm · 0.31mm/px · 7 of 22 slices shown]
[im 1/22]
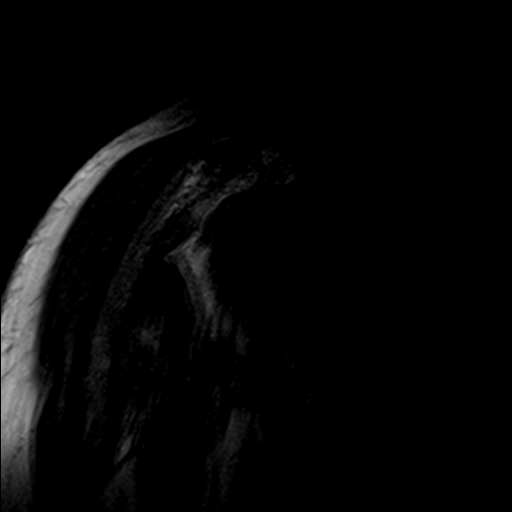
[im 4/22]
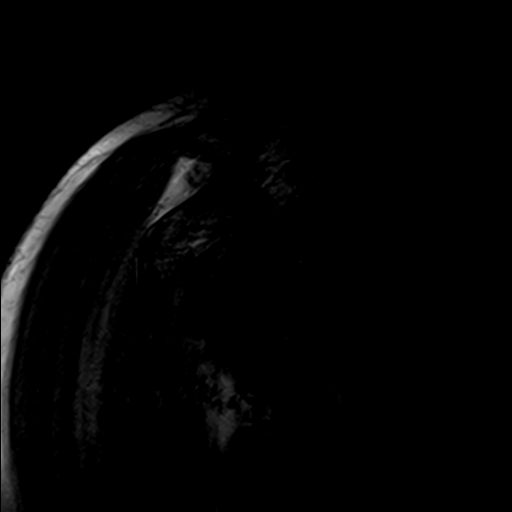
[im 8/22]
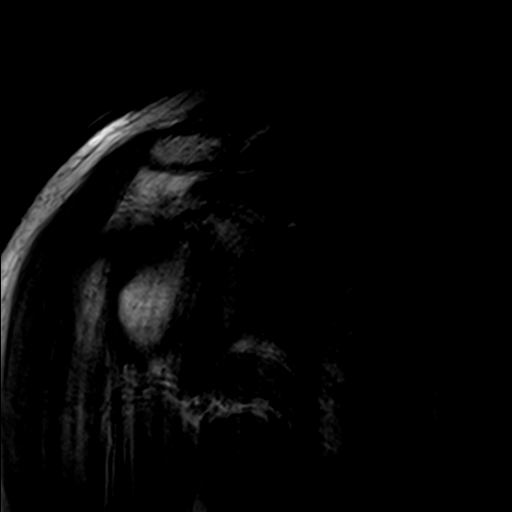
[im 11/22]
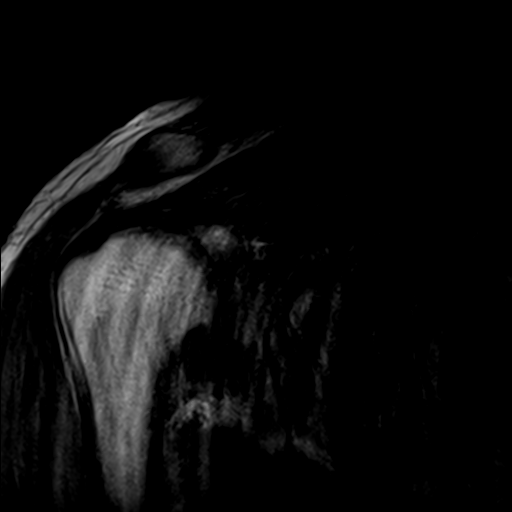
[im 15/22]
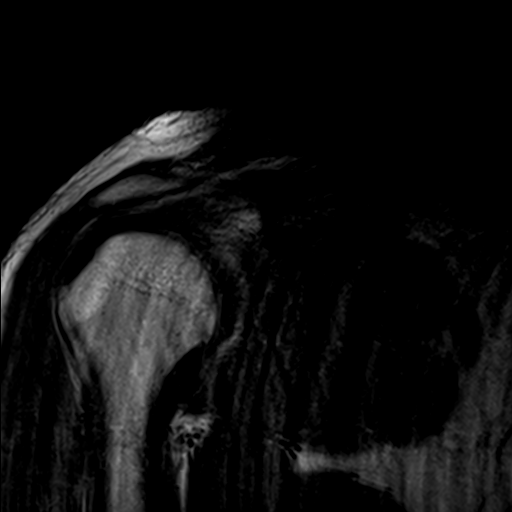
[im 18/22]
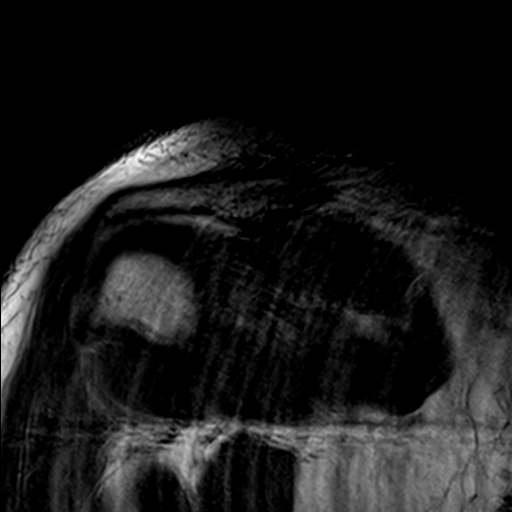
[im 22/22]
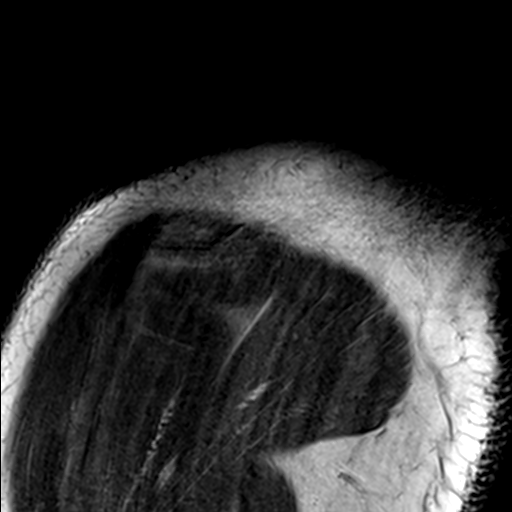

[Series 6: T1 · oblique · 3.0mm · 0.26mm/px · 9 of 28 slices shown]
[im 1/28]
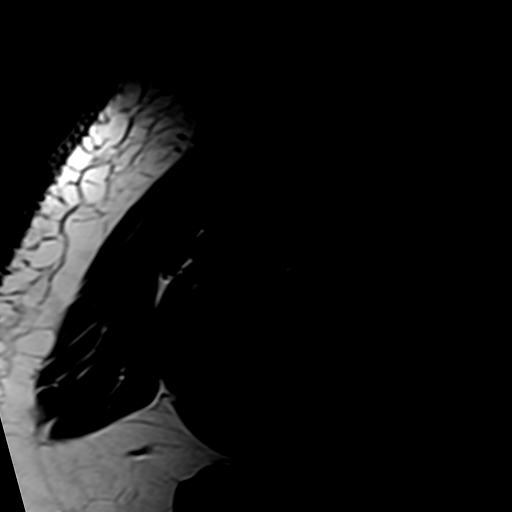
[im 4/28]
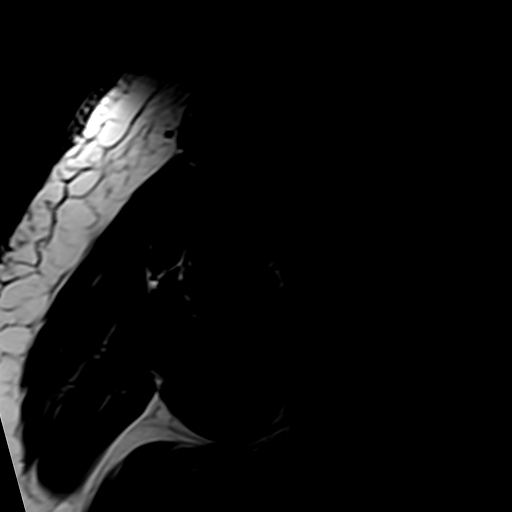
[im 7/28]
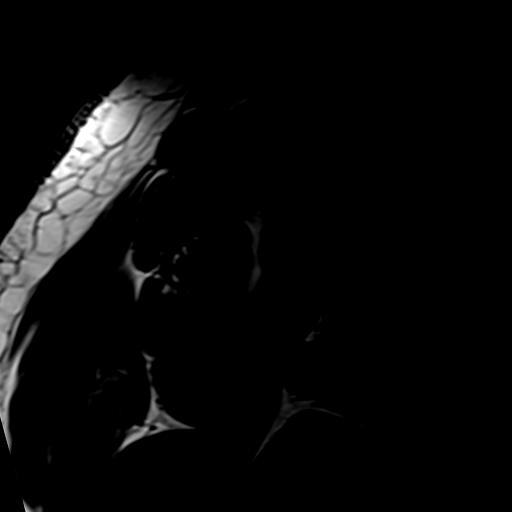
[im 11/28]
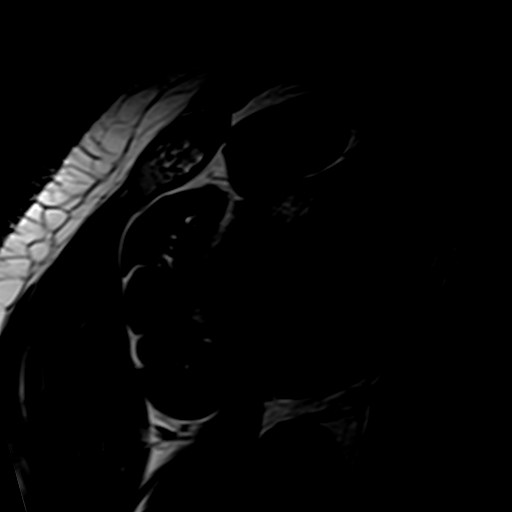
[im 14/28]
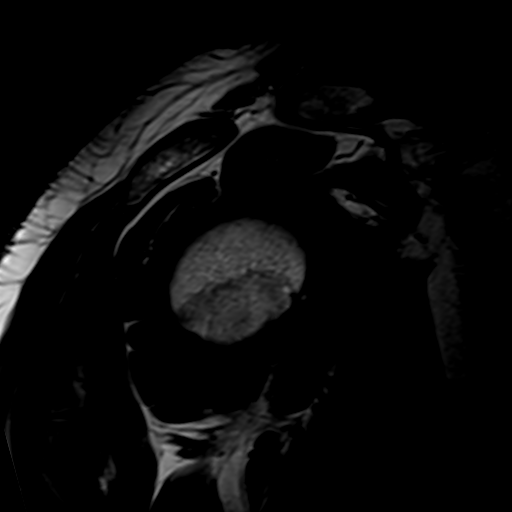
[im 17/28]
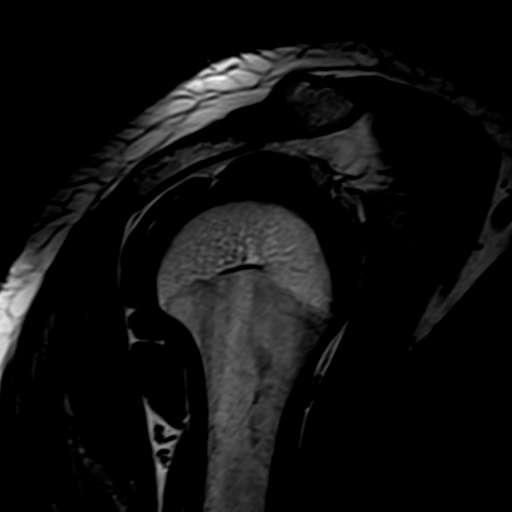
[im 21/28]
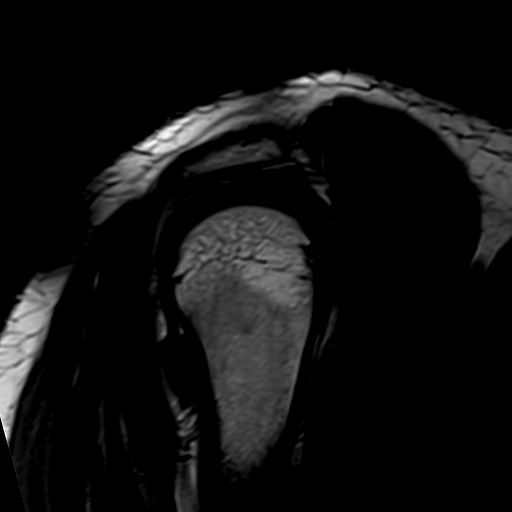
[im 24/28]
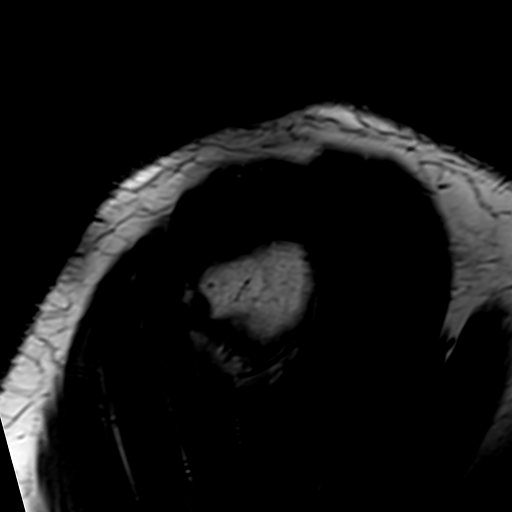
[im 28/28]
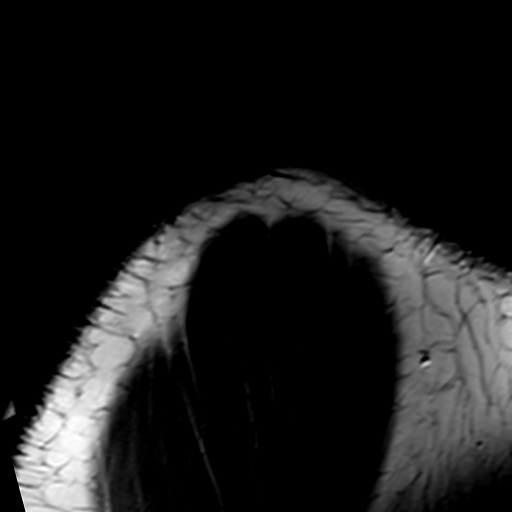

[27 of 40 positions shown; findings below may reference images not displayed]

FINDINGS: Examination is quite limited due to patient motion.

Rotator cuff:  Intact.  Minimal tendinopathy.

Muscles:  Normal.

Biceps long head:  Intact.

Acromioclavicular Joint: No degenerative changes. The acromion is
type 1 in shape. Mild lateral downsloping but no undersurface
spurring.

Glenohumeral Joint: Small joint effusion. No degenerative changes.
There is thickening of the capsular structures in the axillary
recess which can be seen with adhesive capsulitis or synovitis.

Labrum:  No obvious labral tear.

Bones:  No bone contusion or marrow edema.
IMPRESSION: 1. Limited examination due to patient motion.
2. Minimal rotator cuff tendinopathy/tendinosis. No partial or
full-thickness tear.
3. Intact long head biceps tendon and grossly normal glenoid labrum.
4. Small glenohumeral joint effusion. There is thickening of the
capsular structures in the axillary recess which can be seen with
adhesive capsulitis or synovitis.
5. Mild lateral downsloping of the acromion but no other findings
for bony impingement.

## 2017-09-08 ENCOUNTER — Encounter: Payer: Self-pay | Admitting: Family Medicine

## 2017-09-08 ENCOUNTER — Ambulatory Visit: Payer: Medicaid Other | Admitting: Family Medicine

## 2017-09-08 VITALS — Temp 98.9°F | Ht 74.0 in | Wt 268.4 lb

## 2017-09-08 DIAGNOSIS — K648 Other hemorrhoids: Secondary | ICD-10-CM | POA: Diagnosis not present

## 2017-09-08 DIAGNOSIS — K602 Anal fissure, unspecified: Secondary | ICD-10-CM

## 2017-09-08 DIAGNOSIS — S31831A Laceration without foreign body of anus, initial encounter: Secondary | ICD-10-CM

## 2017-09-08 DIAGNOSIS — K611 Rectal abscess: Secondary | ICD-10-CM

## 2017-09-08 MED ORDER — METRONIDAZOLE 500 MG PO TABS: 500.0000 mg | ORAL_TABLET | Freq: Two times a day (BID) | ORAL | 0 refills | Status: DC

## 2017-09-08 MED ORDER — CIPROFLOXACIN HCL 500 MG PO TABS: 500.0000 mg | ORAL_TABLET | Freq: Two times a day (BID) | ORAL | 0 refills | Status: DC

## 2017-09-08 NOTE — Progress Notes (Signed)
   Subjective:    Patient ID: Tanner Rhodes, male    DOB: 1995/04/15, 22 y.o.   MRN: [id number]  HPI  Patient arrives with rectal problems-pain for over 2 months. Patient relates rectal pain is been present over the past couple months hurts with certain bowel movements his bowel movements tend to be very hard he does not eat a lot of fiber in addition to this he also relates pus drainage intermittently from pustules on his rectum region he also relates a hemorrhoid and skin tag that bothers him Review of Systems Denies rectal bleeding does relate constipation denies abdominal pain denies wheezing difficulty breathing    Objective:   Physical Exam Abdomen soft lungs clear heart regular multiple pustules noted on the at lower rectum region hemorrhoid noted skin tag noted anal tear noted       Assessment & Plan:  Multiple small infections noted around the anus that appear to be pustules we will treat with a round of antibiotics Recently has a significant anal tear we will try topical medicine but if he has persistence of this it may need surgical treatment  Also has a hemorrhoid plus also a anal skin tag that he would like to have removed recommend consultation with surgeon

## 2017-09-09 ENCOUNTER — Telehealth: Payer: Self-pay | Admitting: Family Medicine

## 2017-09-09 NOTE — Telephone Encounter (Signed)
Tanner Rhodes from [facility] called to let us know that lidocaine has been on backorder for a while. Tanner Rhodes stated he could add Lidocaine 5% to the Nifedipine compound. Spoke with Dr.Scott about this and Dr.Scott stated this was OK. Dr.Scott advised nurse to let pharmacist coach patient to use a small amount when needed.

## 2017-09-24 ENCOUNTER — Ambulatory Visit: Payer: Medicare Other | Admitting: General Surgery

## 2017-10-06 ENCOUNTER — Ambulatory Visit: Payer: Medicare Other | Admitting: General Surgery

## 2017-10-14 ENCOUNTER — Telehealth: Payer: Self-pay | Admitting: Family Medicine

## 2017-10-14 NOTE — Telephone Encounter (Signed)
Tresa EndoKelly from Dr[facility] office calling to make us aware the pt had an appt on 09/24/17 and he canceled that one. He rescheduled for 10/06/17 and no showed that appt.

## 2017-10-15 NOTE — Telephone Encounter (Signed)
So noted 

## 2018-07-04 ENCOUNTER — Encounter: Payer: Self-pay | Admitting: General Surgery

## 2018-09-15 DIAGNOSIS — Z029 Encounter for administrative examinations, unspecified: Secondary | ICD-10-CM

## 2019-08-23 DIAGNOSIS — Z029 Encounter for administrative examinations, unspecified: Secondary | ICD-10-CM

## 2019-10-01 ENCOUNTER — Other Ambulatory Visit: Payer: Self-pay

## 2019-10-01 ENCOUNTER — Ambulatory Visit
Admission: EM | Admit: 2019-10-01 | Discharge: 2019-10-01 | Disposition: A | Discharge status: Home / Self Care | Payer: Medicare Other | Attending: Emergency Medicine | Admitting: Emergency Medicine

## 2019-10-01 ENCOUNTER — Encounter: Payer: Self-pay | Admitting: Emergency Medicine

## 2019-10-01 DIAGNOSIS — R509 Fever, unspecified: Secondary | ICD-10-CM | POA: Diagnosis not present

## 2019-10-01 DIAGNOSIS — R6889 Other general symptoms and signs: Secondary | ICD-10-CM

## 2019-10-01 MED ORDER — ACETAMINOPHEN 325 MG PO TABS
650.0000 mg | ORAL_TABLET | Freq: Once | ORAL | Status: AC
  Administered 2019-10-01: 650 mg via ORAL

## 2019-10-01 MED ORDER — IBUPROFEN 800 MG PO TABS: 800.0000 mg | ORAL_TABLET | Freq: Three times a day (TID) | ORAL | 0 refills | Status: AC

## 2019-10-01 MED ORDER — CETIRIZINE HCL 10 MG PO TABS: 10.0000 mg | ORAL_TABLET | Freq: Every day | ORAL | 0 refills | Status: AC

## 2019-10-01 MED ORDER — ONDANSETRON HCL 4 MG PO TABS: 4.0000 mg | ORAL_TABLET | Freq: Four times a day (QID) | ORAL | 0 refills | Status: AC

## 2019-10-01 MED ORDER — ACETAMINOPHEN 500 MG PO TABS: 500.0000 mg | ORAL_TABLET | Freq: Four times a day (QID) | ORAL | 0 refills | Status: AC | PRN

## 2019-10-01 MED ORDER — BENZONATATE 100 MG PO CAPS: 100.0000 mg | ORAL_CAPSULE | Freq: Three times a day (TID) | ORAL | 0 refills | Status: AC

## 2019-10-01 MED ORDER — FLUTICASONE PROPIONATE 50 MCG/ACT NA SUSP: 2.0000 | Freq: Every day | NASAL | 0 refills | Status: AC

## 2019-10-01 NOTE — ED Provider Notes (Signed)
[facility]   [id number] 10/01/19 Arrival Time: 1407   CC: Flu like symptoms  SUBJECTIVE: History from: patient.  NICHOLA WARREN is a 24 y.o. male who presents with fatigue, headache, and nausea x 5 days.  Complains of associated runny nose, congestion, and cough.  Denies sick exposure to COVID, flu or strep.  Reports possible black mold exposure outside.  Denies alleviating or aggravating factors.  Denies previous symptoms in the past.   Denies chills, SOB, wheezing, chest pain, vomiting, changes in bowel or bladder habits.    ROS: As per HPI.  All other pertinent ROS negative.     History reviewed. No pertinent past medical history. History reviewed. No pertinent surgical history. Allergies  Allergen Reactions  . Shrimp [Shellfish Allergy] Anaphylaxis   No current facility-administered medications on file prior to encounter.   Current Outpatient Medications on File Prior to Encounter  Medication Sig Dispense Refill  . [DISCONTINUED] traZODone (DESYREL) 50 MG tablet Take 0.5-1 tablets (25-50 mg total) by mouth at bedtime as needed for sleep. 30 tablet 3   Social History   Socioeconomic History  . Marital status: Single    Spouse name: Not on file  . Number of children: Not on file  . Years of education: in 9th gr.  . Highest education level: Not on file  Occupational History  . Occupation: Consulting civil engineer  Tobacco Use  . Smoking status: Former Games developer  . Smokeless tobacco: Current User  Substance and Sexual Activity  . Alcohol use: Yes  . Drug use: No  . Sexual activity: Not on file  Other Topics Concern  . Not on file  Social History Narrative  . Not on file   Social Determinants of Health   Financial Resource Strain:   . Difficulty of Paying Living Expenses:   Food Insecurity:   . Worried About Programme researcher, broadcasting/film/video in the Last Year:   . Barista in the Last Year:   Transportation Needs:   . Freight forwarder (Medical):   Marland Kitchen Lack of  Transportation (Non-Medical):   Physical Activity:   . Days of Exercise per Week:   . Minutes of Exercise per Session:   Stress:   . Feeling of Stress :   Social Connections:   . Frequency of Communication with Friends and Family:   . Frequency of Social Gatherings with Friends and Family:   . Attends Religious Services:   . Active Member of Clubs or Organizations:   . Attends Banker Meetings:   Marland Kitchen Marital Status:   Intimate Partner Violence:   . Fear of Current or Ex-Partner:   . Emotionally Abused:   Marland Kitchen Physically Abused:   . Sexually Abused:    History reviewed. No pertinent family history.  OBJECTIVE:  Vitals:   10/01/19 1419  BP: (!) 131/85  Pulse: 101  Resp: 18  Temp: (!) 101.9 F (38.8 C)  TempSrc: Oral  SpO2: 97%    General appearance: alert; appears fatigued, but nontoxic; speaking in full sentences and tolerating own secretions HEENT: NCAT; Ears: EACs clear, TMs pearly gray; Eyes: PERRL.  EOM grossly intact. Nose: nares patent without rhinorrhea, turbinates swollen and erythematous, Throat: oropharynx clear, tonsils non erythematous or enlarged, uvula midline  Neck: supple without LAD Lungs: unlabored respirations, symmetrical air entry; cough: absent; no respiratory distress; CTAB Heart: regular rate and rhythm.   Skin: warm and dry Psychological: alert and cooperative; normal mood and affect  ASSESSMENT & PLAN:  1. Fever, unspecified   2. Flu-like symptoms     Meds ordered this encounter  Medications  . ibuprofen (ADVIL) 800 MG tablet    Sig: Take 1 tablet (800 mg total) by mouth 3 (three) times daily.    Dispense:  21 tablet    Refill:  0    Order Specific Question:   Supervising Provider    Answer:   Eustace Moore [[id number]]  . ondansetron (ZOFRAN) 4 MG tablet    Sig: Take 1 tablet (4 mg total) by mouth every 6 (six) hours.    Dispense:  12 tablet    Refill:  0    Order Specific Question:   Supervising Provider    Answer:    Eustace Moore [[id number]]  . acetaminophen (TYLENOL) 500 MG tablet    Sig: Take 1 tablet (500 mg total) by mouth every 6 (six) hours as needed.    Dispense:  30 tablet    Refill:  0    Order Specific Question:   Supervising Provider    Answer:   Eustace Moore [[id number]]  . acetaminophen (TYLENOL) tablet 650 mg  . cetirizine (ZYRTEC) 10 MG tablet    Sig: Take 1 tablet (10 mg total) by mouth daily.    Dispense:  30 tablet    Refill:  0    Order Specific Question:   Supervising Provider    Answer:   Eustace Moore [[id number]]  . fluticasone (FLONASE) 50 MCG/ACT nasal spray    Sig: Place 2 sprays into both nostrils daily.    Dispense:  16 g    Refill:  0    Order Specific Question:   Supervising Provider    Answer:   Eustace Moore [[id number]]  . benzonatate (TESSALON) 100 MG capsule    Sig: Take 1 capsule (100 mg total) by mouth every 8 (eight) hours.    Dispense:  21 capsule    Refill:  0    Order Specific Question:   Supervising Provider    Answer:   Eustace Moore [[id number]]   COVID testing ordered.  It will take between 2-5 days for test results.  Someone will contact you regarding abnormal results.    In the meantime: You should remain isolated in your home for 10 days from symptom onset AND greater than 72 hours after symptoms resolution (absence of fever without the use of fever-reducing medication and improvement in respiratory symptoms), whichever is longer Get plenty of rest and push fluids Zyrtec, flonase and tessalon perles prescribed. Call or go to the ED if you have any new or worsening symptoms such as fever, cough, shortness of breath, chest tightness, chest pain, turning blue, changes in mental status, etc...     Reviewed expectations re: course of current medical issues. Questions answered. Outlined signs and symptoms indicating need for more acute intervention. Patient verbalized understanding. After Visit Summary given.         Rennis Harding, PA-C 10/01/19 1434

## 2019-10-01 NOTE — ED Triage Notes (Signed)
Pt here for weakness and nausea 5 days since moving a speaker with mold noted; pt noted to have fever was unaware prior

## 2019-10-01 NOTE — Discharge Instructions (Signed)
COVID testing ordered.  It will take between 2-5 days for test results.  Someone will contact you regarding abnormal results.    In the meantime: You should remain isolated in your home for 10 days from symptom onset AND greater than 72 hours after symptoms resolution (absence of fever without the use of fever-reducing medication and improvement in respiratory symptoms), whichever is longer Get plenty of rest and push fluids Use OTC zyrtec for nasal congestion, runny nose, and/or sore throat Use OTC flonase for nasal congestion and runny nose Use medications daily for symptom relief Use OTC medications like ibuprofen or tylenol as needed fever or pain Call or go to the ED if you have any new or worsening symptoms such as fever, cough, shortness of breath, chest tightness, chest pain, turning blue, changes in mental status, etc...  

## 2019-10-02 LAB — NOVEL CORONAVIRUS, NAA: SARS-CoV-2, NAA: DETECTED — AB

## 2019-10-02 LAB — SARS-COV-2, NAA 2 DAY TAT

## 2019-10-03 ENCOUNTER — Telehealth: Payer: Self-pay | Admitting: Physician Assistant

## 2019-10-03 NOTE — Telephone Encounter (Signed)
Called to discuss with Tanner Rhodes about Covid symptoms and the use of bamlanivimab/etesevimab or casirivimab/imdevimab, a monoclonal antibody infusion for those with mild to moderate Covid symptoms and at a high risk of hospitalization.     Pt is qualified for this infusion at the monoclonal antibody infusion center due to co-morbid conditions and/or a member of an at-risk group, however would like to think more about infusion at this time. Symptoms tier reviewed as well as criteria for ending isolation.  Symptoms reviewed that would warrant ED/Hospital evaluation. Preventative practices reviewed. Patient verbalized understanding. Patient advised to call back if he decides that he does want to get infusion. Callback number to the infusion center given. Patient advised to go to Urgent care or ED with severe symptoms. Last date pt would be eligible for infusion is 7/30.     Patient Active Problem List   Diagnosis Date Noted  . Fracture of phalanx of finger 12/02/2010    Cline Crock PA-C

## 2020-09-24 ENCOUNTER — Ambulatory Visit
Admission: EM | Admit: 2020-09-24 | Discharge: 2020-09-24 | Discharge status: Against Medical Advice | Payer: Medicare Other

## 2020-09-24 ENCOUNTER — Other Ambulatory Visit: Payer: Self-pay

## 2020-09-25 ENCOUNTER — Encounter: Payer: Self-pay | Admitting: Emergency Medicine

## 2020-09-25 ENCOUNTER — Ambulatory Visit
Admission: EM | Admit: 2020-09-25 | Discharge: 2020-09-25 | Disposition: A | Discharge status: Home / Self Care | Payer: Medicare Other

## 2020-09-25 ENCOUNTER — Other Ambulatory Visit: Payer: Self-pay

## 2020-09-25 DIAGNOSIS — R82998 Other abnormal findings in urine: Secondary | ICD-10-CM | POA: Diagnosis not present

## 2020-09-25 DIAGNOSIS — T63421A Toxic effect of venom of ants, accidental (unintentional), initial encounter: Secondary | ICD-10-CM

## 2020-09-25 DIAGNOSIS — R410 Disorientation, unspecified: Secondary | ICD-10-CM | POA: Diagnosis not present

## 2020-09-25 LAB — POCT URINALYSIS DIP (MANUAL ENTRY)
Bilirubin, UA: NEGATIVE
Glucose, UA: NEGATIVE mg/dL
Ketones, POC UA: NEGATIVE mg/dL
Leukocytes, UA: NEGATIVE
Nitrite, UA: NEGATIVE
Protein Ur, POC: NEGATIVE mg/dL
Spec Grav, UA: 1.025 (ref 1.010–1.025)
Urobilinogen, UA: 1 E.U./dL
pH, UA: 6.5 (ref 5.0–8.0)

## 2020-09-25 LAB — POCT FASTING CBG KUC MANUAL ENTRY: POCT Glucose (KUC): 107 mg/dL — AB (ref 70–99)

## 2020-09-25 NOTE — ED Triage Notes (Addendum)
Patient c/o confusion x 1 week.   Patient endorses 4-5 days of urinary frequency and dark yellow urine.   Patient denies nausea and vomiting. Patient denies ABD pain.   Patient endorses diarrhea.   Patient endorses an episode of LFT eye droopiness.   Patient endorses "feeling off balance at times".   Patient denies Chest Pain or LOC.   Patient endorses "slight SOB and I feel like the rate of my heart is going slower than usual".   Patient endorses insomnia.   Patient hasn't used any medications for symptoms.   Patient c/o Insect Bite x 1 week.   Patient endorses bites occurred on RT foot.   Patient endorses " red ants bit me".   Patient has used " spray and antibiotic ointment" with some relief of symptoms.

## 2020-09-25 NOTE — ED Provider Notes (Signed)
[facility]   MRN: [id number] DOB: [birth year]  Subjective:   Tanner Rhodes is a 25 y.o. male presenting for 4-week history of persistent intermittent confusion, dark urine, urinary frequency, diarrhea, malaise and fatigue, intermittent slight shortness of breath and feeling heart racing.  Patient is concerned about serious illness after searching diagnoses on Google.  Denies fevers, active chest pain, abdominal pain, hematuria, dysuria.  Patient is otherwise healthy.  Uses hydrocodone for pain of his right shoulder.  No fracture to his knowledge.  He is also concerned about at bites that he suffered several days ago.  Has been using a spray and antibiotic ointment with good relief.  Denies drainage of pus, bleeding, erythema.  Currently takes hydrocodone.  Allergies  Allergen Reactions   Shrimp [Shellfish Allergy] Anaphylaxis    History reviewed. No pertinent past medical history.   History reviewed. No pertinent surgical history.  Has a family history of diabetes.  Social History   Tobacco Use   Smoking status: Former   Smokeless tobacco: Current  Substance Use Topics   Alcohol use: Yes   Drug use: No    ROS   Objective:   Vitals: BP 129/80 (BP Location: Right Arm)   Pulse 84   Temp 98 F (36.7 C) (Oral)   Resp 16   SpO2 97%   Physical Exam Constitutional:      General: He is not in acute distress.    Appearance: Normal appearance. He is well-developed and normal weight. He is not ill-appearing, toxic-appearing or diaphoretic.  HENT:     Head: Normocephalic and atraumatic.     Right Ear: External ear normal.     Left Ear: External ear normal.     Nose: Nose normal.     Mouth/Throat:     Mouth: Mucous membranes are moist.     Pharynx: Oropharynx is clear. No oropharyngeal exudate or posterior oropharyngeal erythema.  Eyes:     General: No scleral icterus.       Right eye: No discharge.        Left eye: No discharge.     Extraocular  Movements: Extraocular movements intact.     Conjunctiva/sclera: Conjunctivae normal.     Pupils: Pupils are equal, round, and reactive to light.  Cardiovascular:     Rate and Rhythm: Normal rate and regular rhythm.     Heart sounds: Normal heart sounds. No murmur heard.   No friction rub. No gallop.  Pulmonary:     Effort: Pulmonary effort is normal. No respiratory distress.     Breath sounds: Normal breath sounds. No stridor. No wheezing, rhonchi or rales.  Musculoskeletal:     Cervical back: Normal range of motion.  Skin:    General: Skin is warm and dry.     Findings: Rash (resolving ant bite wounds of right lower legs) present.  Neurological:     Mental Status: He is alert and oriented to person, place, and time.     Cranial Nerves: No cranial nerve deficit.     Motor: No weakness.     Coordination: Coordination normal.     Gait: Gait normal.     Deep Tendon Reflexes: Reflexes normal.     Comments: Negative Romberg and pronator drift.  Psychiatric:        Mood and Affect: Mood normal.        Behavior: Behavior normal.        Thought Content: Thought content normal.  Judgment: Judgment normal.    Results for orders placed or performed during the hospital encounter of 09/25/20 (from the past 24 hour(s))  POCT CBG (manual entry)     Status: Abnormal   Collection Time: 09/25/20 12:22 PM  Result Value Ref Range   POCT Glucose ([facility]) 107 (A) 70 - 99 mg/dL  POCT urinalysis dipstick     Status: Abnormal   Collection Time: 09/25/20 12:36 PM  Result Value Ref Range   Color, UA yellow yellow   Clarity, UA clear clear   Glucose, UA negative negative mg/dL   Bilirubin, UA negative negative   Ketones, POC UA negative negative mg/dL   Spec Grav, UA 1.245 8.099 - 1.025   Blood, UA trace-intact (A) negative   pH, UA 6.5 5.0 - 8.0   Protein Ur, POC negative negative mg/dL   Urobilinogen, UA 1.0 0.2 or 1.0 E.U./dL   Nitrite, UA Negative Negative   Leukocytes, UA Negative  Negative    Assessment and Plan :   PDMP not reviewed this encounter.  1. Subacute confusional state   2. Fire ant bite, accidental or unintentional, initial encounter   3. Dark urine     Recommended patient revisit the use of hydrocodone as I suspect this may be one of the sources of his symptoms.  Recommended hydrating much better eating regular healthy balanced meals.  Use supportive care otherwise.  Ant bites are resolving and therefore will hold off on using any particular prescriptions for this as I do not suspect developing infection.  Follow-up closely with PCP.  Counseled patient on potential for adverse effects with medications prescribed/recommended today, ER and return-to-clinic precautions discussed, patient verbalized understanding.    Wallis Bamberg, PA-C 09/25/20 1254

## 2020-09-25 NOTE — Discharge Instructions (Addendum)
I recommend you discuss using hydrocodone with the healthcare provider prescribing this to as this can cause some of the symptoms that you are experiencing.  We will let you know about your COVID-19 test.  In the meantime make sure that you are hydrating very well, eat regular healthy balanced meals.  You can use over-the-counter hydrocortisone for the ant bites twice a day if they are itching.  If you develop worsening confusion, chest pain, difficulty with your breathing then please report to the emergency room.

## 2020-09-26 LAB — SARS-COV-2, NAA 2 DAY TAT

## 2020-09-26 LAB — NOVEL CORONAVIRUS, NAA: SARS-CoV-2, NAA: NOT DETECTED

## 2020-10-19 ENCOUNTER — Encounter (HOSPITAL_COMMUNITY): Payer: Self-pay | Admitting: *Deleted

## 2020-10-19 ENCOUNTER — Other Ambulatory Visit: Payer: Self-pay

## 2020-10-19 ENCOUNTER — Emergency Department (HOSPITAL_COMMUNITY)
Admission: EM | Admit: 2020-10-19 | Discharge: 2020-10-19 | Disposition: A | Discharge status: Home / Self Care | Payer: Medicare Other | Attending: Emergency Medicine | Admitting: Emergency Medicine

## 2020-10-19 DIAGNOSIS — R1011 Right upper quadrant pain: Secondary | ICD-10-CM | POA: Insufficient documentation

## 2020-10-19 DIAGNOSIS — F22 Delusional disorders: Secondary | ICD-10-CM | POA: Diagnosis not present

## 2020-10-19 DIAGNOSIS — Z87891 Personal history of nicotine dependence: Secondary | ICD-10-CM | POA: Diagnosis not present

## 2020-10-19 DIAGNOSIS — R110 Nausea: Secondary | ICD-10-CM | POA: Insufficient documentation

## 2020-10-19 DIAGNOSIS — Z79899 Other long term (current) drug therapy: Secondary | ICD-10-CM | POA: Insufficient documentation

## 2020-10-19 DIAGNOSIS — Y900 Blood alcohol level of less than 20 mg/100 ml: Secondary | ICD-10-CM | POA: Diagnosis not present

## 2020-10-19 LAB — CBC
HCT: 44.7 % (ref 39.0–52.0)
Hemoglobin: 14.4 g/dL (ref 13.0–17.0)
MCH: 25.5 pg — ABNORMAL LOW (ref 26.0–34.0)
MCHC: 32.2 g/dL (ref 30.0–36.0)
MCV: 79.1 fL — ABNORMAL LOW (ref 80.0–100.0)
Platelets: 216 10*3/uL (ref 150–400)
RBC: 5.65 MIL/uL (ref 4.22–5.81)
RDW: 14.6 % (ref 11.5–15.5)
WBC: 5.6 10*3/uL (ref 4.0–10.5)
nRBC: 0 % (ref 0.0–0.2)

## 2020-10-19 LAB — RAPID URINE DRUG SCREEN, HOSP PERFORMED
Amphetamines: NOT DETECTED
Barbiturates: NOT DETECTED
Benzodiazepines: NOT DETECTED
Cocaine: NOT DETECTED
Opiates: NOT DETECTED
Tetrahydrocannabinol: NOT DETECTED

## 2020-10-19 LAB — URINALYSIS, ROUTINE W REFLEX MICROSCOPIC
Bacteria, UA: NONE SEEN
Bilirubin Urine: NEGATIVE
Glucose, UA: NEGATIVE mg/dL
Ketones, ur: NEGATIVE mg/dL
Leukocytes,Ua: NEGATIVE
Nitrite: NEGATIVE
Protein, ur: NEGATIVE mg/dL
Specific Gravity, Urine: 1.002 — ABNORMAL LOW (ref 1.005–1.030)
pH: 6 (ref 5.0–8.0)

## 2020-10-19 LAB — COMPREHENSIVE METABOLIC PANEL
ALT: 14 U/L (ref 0–44)
AST: 15 U/L (ref 15–41)
Albumin: 4.4 g/dL (ref 3.5–5.0)
Alkaline Phosphatase: 48 U/L (ref 38–126)
Anion gap: 8 (ref 5–15)
BUN: 9 mg/dL (ref 6–20)
CO2: 24 mmol/L (ref 22–32)
Calcium: 9 mg/dL (ref 8.9–10.3)
Chloride: 104 mmol/L (ref 98–111)
Creatinine, Ser: 0.94 mg/dL (ref 0.61–1.24)
GFR, Estimated: >60 mL/min (ref >60)
Glucose, Bld: 112 mg/dL — ABNORMAL HIGH (ref 70–99)
Potassium: 3.7 mmol/L (ref 3.5–5.1)
Sodium: 136 mmol/L (ref 135–145)
Total Bilirubin: 0.8 mg/dL (ref 0.3–1.2)
Total Protein: 8.1 g/dL (ref 6.5–8.1)

## 2020-10-19 LAB — ETHANOL: Alcohol, Ethyl (B): <10 mg/dL (ref <10)

## 2020-10-19 LAB — LIPASE, BLOOD: Lipase: 31 U/L (ref 11–51)

## 2020-10-19 LAB — ACETAMINOPHEN LEVEL: Acetaminophen (Tylenol), Serum: <10 ug/mL — ABNORMAL LOW (ref 10–30)

## 2020-10-19 LAB — SALICYLATE LEVEL: Salicylate Lvl: <7 mg/dL — ABNORMAL LOW (ref 7.0–30.0)

## 2020-10-19 NOTE — ED Notes (Signed)
Pt states he saw some worms in his stool recently.

## 2020-10-19 NOTE — Discharge Instructions (Addendum)
Your urine and blood test today were reassuring.  I recommend that you follow-up with your primary care provider for recheck.  I have also provided a resource list for you to follow-up with as well.  Return to the emergency department for any new or worsening symptoms.

## 2020-10-19 NOTE — ED Notes (Signed)
Pt states that he has worm in his stool and states that there are worms in his mouth and eyes.  none noted to mouth and eye.  Pt is poor historian and his story about why he is here doesn't make much sense.  Pt states sentence but doesn't finish all of them.

## 2020-10-19 NOTE — ED Triage Notes (Signed)
Pt with abd pain with diarrhea x 3 days. Denies any N/V.

## 2020-10-19 NOTE — ED Provider Notes (Signed)
[facility] EMERGENCY DEPARTMENT Provider Note   CSN: [id number] Arrival date & time: 10/19/20  1526     History Chief Complaint  Patient presents with   Abdominal Pain    Tanner Rhodes is a 25 y.o. male.   Abdominal Pain Associated symptoms: nausea   Associated symptoms: no chest pain, no chills, no diarrhea, no dysuria, no fever, no hematuria, no shortness of breath and no vomiting       Tanner Rhodes is a 25 y.o. male with no significant past medical history who presents to the Emergency Department complaining of right upper abdominal pain and seeing worms in his stools.  States he noticed worms coming from his stool 2 days ago.  He also states that he has seen worms in his mouth and coming out his eyes.  States he developed right upper abdominal pain after seeing the worms.  He states that he is trying to do a natural detox taking garlic, ginger, and drinking orange juice.  He also states that he was seen at [facility] and treated with Cipro for an intestinal infection and also given acyclovir for genital herpes.  Believes he may have eaten raw meat.  He denies any recent travel outside of the country, drinking contaminated water or history of mental illness.  He denies diarrhea, vomiting, fever or chills.  No dysuria or back pain.  History reviewed. No pertinent past medical history.  Patient Active Problem List   Diagnosis Date Noted   Fracture of phalanx of finger 12/02/2010    History reviewed. No pertinent surgical history.     History reviewed. No pertinent family history.  Social History   Tobacco Use   Smoking status: Former    Passive exposure: Never   Smokeless tobacco: Former  Substance Use Topics   Alcohol use: Not Currently   Drug use: No    Home Medications Prior to Admission medications   Medication Sig Start Date End Date Taking? Authorizing Provider  acetaminophen (TYLENOL) 500 MG tablet Take 1 tablet (500 mg total) by mouth  every 6 (six) hours as needed. 10/01/19   Wurst, [place], PA-C  benzonatate (TESSALON) 100 MG capsule Take 1 capsule (100 mg total) by mouth every 8 (eight) hours. 10/01/19   Wurst, [place], PA-C  cetirizine (ZYRTEC) 10 MG tablet Take 1 tablet (10 mg total) by mouth daily. 10/01/19   Wurst, [place], PA-C  fluticasone (FLONASE) 50 MCG/ACT nasal spray Place 2 sprays into both nostrils daily. 10/01/19   Wurst, [place], PA-C  HYDROcodone-acetaminophen (NORCO) 10-325 MG tablet Take 1 tablet by mouth 3 (three) times daily. 09/15/20   [provider]  ibuprofen (ADVIL) 800 MG tablet Take 1 tablet (800 mg total) by mouth 3 (three) times daily. 10/01/19   Wurst, [place], PA-C  ondansetron (ZOFRAN) 4 MG tablet Take 1 tablet (4 mg total) by mouth every 6 (six) hours. 10/01/19   Wurst, [place], PA-C  traZODone (DESYREL) 50 MG tablet Take 0.5-1 tablets (25-50 mg total) by mouth at bedtime as needed for sleep. 03/12/17 10/01/19  Babs Sciara, MD    Allergies    Shrimp [shellfish allergy]  Review of Systems   Review of Systems  Constitutional:  Negative for chills and fever.  HENT:  Negative for trouble swallowing.   Respiratory:  Negative for shortness of breath.   Cardiovascular:  Negative for chest pain and palpitations.  Gastrointestinal:  Positive for abdominal pain and nausea. Negative for blood in stool, diarrhea and vomiting.  Worms in his stool  Genitourinary:  Negative for dysuria, flank pain and hematuria.  Musculoskeletal:  Negative for arthralgias, back pain, myalgias, neck pain and neck stiffness.  Skin:  Negative for rash.  Neurological:  Negative for dizziness, weakness and numbness.  Hematological:  Does not bruise/bleed easily.   Physical Exam Updated Vital Signs BP (!) 118/95 (BP Location: Right Arm)   Pulse 87   Temp 98.2 F (36.8 C) (Oral)   Resp 17   Ht 6\' 2"  (1.88 m)   Wt 116.6 kg   SpO2 100%   BMI 33.00 kg/m   Physical Exam Vitals and nursing note  reviewed.  Constitutional:      General: He is not in acute distress.    Appearance: He is well-developed. He is not toxic-appearing.  HENT:     Mouth/Throat:     Mouth: Mucous membranes are moist.     Pharynx: Oropharynx is clear. No oropharyngeal exudate or posterior oropharyngeal erythema.  Eyes:     Extraocular Movements: Extraocular movements intact.     Conjunctiva/sclera: Conjunctivae normal.     Pupils: Pupils are equal, round, and reactive to light.  Cardiovascular:     Rate and Rhythm: Normal rate and regular rhythm.     Pulses: Normal pulses.  Pulmonary:     Effort: Pulmonary effort is normal.     Breath sounds: Normal breath sounds.  Abdominal:     General: There is no distension.     Palpations: Abdomen is soft.     Tenderness: There is no abdominal tenderness.  Musculoskeletal:        General: Normal range of motion.  Skin:    General: Skin is warm.     Capillary Refill: Capillary refill takes less than 2 seconds.     Findings: No erythema or rash.  Neurological:     General: No focal deficit present.     Mental Status: He is alert.     Sensory: No sensory deficit.     Motor: No weakness.    ED Results / Procedures / Treatments   Labs (all labs ordered are listed, but only abnormal results are displayed) Labs Reviewed  COMPREHENSIVE METABOLIC PANEL - Abnormal; Notable for the following components:      Result Value   Glucose, Bld 112 (*)    All other components within normal limits  CBC - Abnormal; Notable for the following components:   MCV 79.1 (*)    MCH 25.5 (*)    All other components within normal limits  URINALYSIS, ROUTINE W REFLEX MICROSCOPIC - Abnormal; Notable for the following components:   Color, Urine STRAW (*)    Specific Gravity, Urine 1.002 (*)    Hgb urine dipstick SMALL (*)    All other components within normal limits  SALICYLATE LEVEL - Abnormal; Notable for the following components:   Salicylate Lvl <7.0 (*)    All other  components within normal limits  ACETAMINOPHEN LEVEL - Abnormal; Notable for the following components:   Acetaminophen (Tylenol), Serum <10 (*)    All other components within normal limits  LIPASE, BLOOD  RAPID URINE DRUG SCREEN, HOSP PERFORMED  ETHANOL    EKG None  Radiology No results found.  Procedures Procedures   Medications Ordered in ED Medications - No data to display  ED Course  I have reviewed the triage vital signs and the nursing notes.  Pertinent labs & imaging results that were available during my care of the patient were reviewed  by me and considered in my medical decision making (see chart for details).    MDM Rules/Calculators/A&P                           Patient here requesting evaluation for right upper abdominal pain and states that he is seeing worms in his stool, coming out of his mouth and eyes.  Patient shows me pictures on his cell phone of loose brown stool.  I do not appreciate any parasites in the photos.  Patient is nontoxic-appearing.  No significant abdominal tenderness on exam.  On review of medical records, patient was seen at local urgent care in July diagnosed with subacute confusional state.  No documented history of mental illness or behavioral health admissions.  Patient denies any history of mental illness, suicidal or homicidal thoughts  [id number] attempted to contact pt mother who is listed as contact.  Non english speaking male answered phone.   Labs interpreted by me, unremarkable for presence of infection or electrolyte derangement.  UDS unremarkable.  Blood alcohol less than 10.  Patient likely with psychosis.  No documented history of mental illness and patient is not currently taking any prescription medications.  Patient is cooperative, does not appear to be a threat to himself or others.  I feel that he is appropriate for discharge home, will provide outpatient psych resource list.  Patient does have a PCP that he may arrange follow-up  with as well.  Patient agreeable to plan.  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

## 2020-11-20 ENCOUNTER — Encounter: Payer: Self-pay | Admitting: Internal Medicine

## 2021-01-09 ENCOUNTER — Encounter: Payer: Self-pay | Admitting: Internal Medicine

## 2021-01-09 ENCOUNTER — Ambulatory Visit: Payer: Medicare Other | Admitting: Internal Medicine
